# Patient Record
Sex: Male | Born: 1970 | Race: White | Hispanic: No | Smoking: Current every day smoker
Health system: Southern US, Community
[De-identification: ages and names within clinical notes are randomized; demographics above are authoritative.]

## PROBLEM LIST (undated history)

## (undated) DIAGNOSIS — I10 Essential (primary) hypertension: Secondary | ICD-10-CM

## (undated) DIAGNOSIS — E785 Hyperlipidemia, unspecified: Secondary | ICD-10-CM

## (undated) HISTORY — DX: Essential (primary) hypertension: I10

## (undated) HISTORY — PX: KNEE ARTHROSCOPY W/ MENISCECTOMY: SHX1879

## (undated) HISTORY — PX: KNEE SURGERY: SHX244

---

## 2006-09-26 ENCOUNTER — Emergency Department (HOSPITAL_COMMUNITY): Admission: EM | Admit: 2006-09-26 | Discharge: 2006-09-26 | Payer: Self-pay | Admitting: Emergency Medicine

## 2006-11-08 ENCOUNTER — Encounter: Admission: RE | Admit: 2006-11-08 | Discharge: 2006-11-08 | Payer: Self-pay | Admitting: Orthopaedic Surgery
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
## 2017-01-18 DIAGNOSIS — I10 Essential (primary) hypertension: Secondary | ICD-10-CM | POA: Insufficient documentation

## 2018-07-30 ENCOUNTER — Emergency Department: Payer: 59

## 2018-07-30 ENCOUNTER — Encounter: Payer: Self-pay | Admitting: Emergency Medicine

## 2018-07-30 ENCOUNTER — Emergency Department
Admission: EM | Admit: 2018-07-30 | Discharge: 2018-07-30 | Disposition: A | Discharge status: Home / Self Care | Payer: 59 | Attending: Student in an Organized Health Care Education/Training Program | Admitting: Student in an Organized Health Care Education/Training Program

## 2018-07-30 ENCOUNTER — Other Ambulatory Visit: Payer: Self-pay

## 2018-07-30 DIAGNOSIS — I10 Essential (primary) hypertension: Secondary | ICD-10-CM

## 2018-07-30 DIAGNOSIS — R1011 Right upper quadrant pain: Secondary | ICD-10-CM

## 2018-07-30 DIAGNOSIS — K439 Ventral hernia without obstruction or gangrene: Secondary | ICD-10-CM | POA: Insufficient documentation

## 2018-07-30 DIAGNOSIS — K8020 Calculus of gallbladder without cholecystitis without obstruction: Secondary | ICD-10-CM | POA: Diagnosis not present

## 2018-07-30 DIAGNOSIS — F17200 Nicotine dependence, unspecified, uncomplicated: Secondary | ICD-10-CM | POA: Insufficient documentation

## 2018-07-30 LAB — COMPREHENSIVE METABOLIC PANEL
ALT: 38 U/L (ref 0–44)
ANION GAP: 8 (ref 5–15)
AST: 28 U/L (ref 15–41)
Albumin: 4.7 g/dL (ref 3.5–5.0)
Alkaline Phosphatase: 66 U/L (ref 38–126)
BILIRUBIN TOTAL: 0.5 mg/dL (ref 0.3–1.2)
BUN: 17 mg/dL (ref 6–20)
CHLORIDE: 107 mmol/L (ref 98–111)
CO2: 22 mmol/L (ref 22–32)
Calcium: 9.1 mg/dL (ref 8.9–10.3)
Creatinine, Ser: 0.84 mg/dL (ref 0.61–1.24)
Glucose, Bld: 104 mg/dL — ABNORMAL HIGH (ref 70–99)
POTASSIUM: 3.9 mmol/L (ref 3.5–5.1)
Sodium: 137 mmol/L (ref 135–145)
TOTAL PROTEIN: 7.8 g/dL (ref 6.5–8.1)

## 2018-07-30 LAB — CBC
HEMATOCRIT: 50.1 % (ref 39.0–52.0)
HEMOGLOBIN: 17.3 g/dL — AB (ref 13.0–17.0)
MCH: 30.7 pg (ref 26.0–34.0)
MCHC: 34.5 g/dL (ref 30.0–36.0)
MCV: 88.8 fL (ref 80.0–100.0)
NRBC: 0 % (ref 0.0–0.2)
Platelets: 245 10*3/uL (ref 150–400)
RBC: 5.64 MIL/uL (ref 4.22–5.81)
RDW: 11.9 % (ref 11.5–15.5)
WBC: 9.9 10*3/uL (ref 4.0–10.5)

## 2018-07-30 LAB — LIPASE, BLOOD: Lipase: 30 U/L (ref 11–51)

## 2018-07-30 MED ORDER — SODIUM CHLORIDE 0.9% FLUSH: 3.0000 mL | Freq: Once | INTRAVENOUS | Status: DC

## 2018-07-30 MED ORDER — PROMETHAZINE HCL 12.5 MG PO TABS: 12.5000 mg | ORAL_TABLET | Freq: Four times a day (QID) | ORAL | 0 refills | Status: AC | PRN

## 2018-07-30 MED ORDER — AMLODIPINE BESYLATE 5 MG PO TABS: 5.0000 mg | ORAL_TABLET | Freq: Every day | ORAL | 0 refills | Status: DC

## 2018-07-30 MED ORDER — NAPROXEN 500 MG PO TABS: 500.0000 mg | ORAL_TABLET | Freq: Two times a day (BID) | ORAL | 0 refills | Status: DC

## 2018-07-30 NOTE — Discharge Instructions (Signed)

## 2018-07-30 NOTE — ED Provider Notes (Signed)
[facility] Emergency Department Provider Note    First MD Initiated Contact with Patient 07/30/18 1417     (approximate)  I have reviewed the triage vital signs and the nursing notes.   HISTORY  Chief Complaint Abdominal Pain    HPI Dominic Gross is a 48 y.o. male with a 1 pack/day smoking history presents the ER for evaluation of right upper quadrant abdominal pain.  Patient also concerned that he is developing a hernia.  Does lift heavy objects at work.  States that the pain in the right upper quadrant is stabbing in nature.  Denies any fevers.  States the pain is mild to moderate.  Denies any chest pain or shortness of breath.  Patient also concerned that his blood pressure is elevated.    History reviewed. No pertinent past medical history. No family history on file. Past Surgical History:  Procedure Laterality Date  . KNEE SURGERY Left    There are no active problems to display for this patient.     Prior to Admission medications   Medication Sig Start Date End Date Taking? Authorizing Provider  amLODipine (NORVASC) 5 MG tablet Take 1 tablet (5 mg total) by mouth daily. 07/30/18 07/30/19  Merlyn Lot, MD  naproxen (NAPROSYN) 500 MG tablet Take 1 tablet (500 mg total) by mouth 2 (two) times daily with a meal. 07/30/18 07/30/19  Merlyn Lot, MD  promethazine (PHENERGAN) 12.5 MG tablet Take 1 tablet (12.5 mg total) by mouth every 6 (six) hours as needed for nausea or vomiting. 07/30/18   Merlyn Lot, MD    Allergies Patient has no known allergies.    Social History Social History   Tobacco Use  . Smoking status: Current Every Day Smoker  . Smokeless tobacco: Never Used  Substance Use Topics  . Alcohol use: Not Currently  . Drug use: Not on file    Review of Systems Patient denies headaches, rhinorrhea, blurry vision, numbness, shortness of breath, chest pain, edema, cough, abdominal pain, nausea, vomiting, diarrhea,  dysuria, fevers, rashes or hallucinations unless otherwise stated above in HPI. ____________________________________________   PHYSICAL EXAM:  VITAL SIGNS: Vitals:   07/30/18 1240  BP: (!) 166/98  Pulse: 100  Resp: 16  Temp: 98.1 F (36.7 C)  SpO2: 96%    Constitutional: Alert and oriented.  Eyes: Conjunctivae are normal.  Head: Atraumatic. Nose: No congestion/rhinnorhea. Mouth/Throat: Mucous membranes are moist.   Neck: No stridor. Painless ROM.  Cardiovascular: Normal rate, regular rhythm. Grossly normal heart sounds.  Good peripheral circulation. Respiratory: Normal respiratory effort.  No retractions. Lungs CTAB. Gastrointestinal: Soft with soft reducible ventral hernia only present with Valsalva.  Mild tenderness to palpation the right upper quadrant without peritonitis or rebound or guarding.  No distention. No abdominal bruits. No CVA tenderness. Genitourinary: deferred Musculoskeletal: No lower extremity tenderness nor edema.  No joint effusions. Neurologic:  Normal speech and language. No gross focal neurologic deficits are appreciated. No facial droop Skin:  Skin is warm, dry and intact. No rash noted. Psychiatric: Mood and affect are normal. Speech and behavior are normal.  ____________________________________________   LABS (all labs ordered are listed, but only abnormal results are displayed)  Results for orders placed or performed during the hospital encounter of 07/30/18 (from the past 24 hour(s))  Lipase, blood     Status: None   Collection Time: 07/30/18 12:50 PM  Result Value Ref Range   Lipase 30 11 - 51 U/L  Comprehensive metabolic panel  Status: Abnormal   Collection Time: 07/30/18 12:50 PM  Result Value Ref Range   Sodium 137 135 - 145 mmol/L   Potassium 3.9 3.5 - 5.1 mmol/L   Chloride 107 98 - 111 mmol/L   CO2 22 22 - 32 mmol/L   Glucose, Bld 104 (H) 70 - 99 mg/dL   BUN 17 6 - 20 mg/dL   Creatinine, Ser 0.84 0.61 - 1.24 mg/dL   Calcium 9.1  8.9 - 10.3 mg/dL   Total Protein 7.8 6.5 - 8.1 g/dL   Albumin 4.7 3.5 - 5.0 g/dL   AST 28 15 - 41 U/L   ALT 38 0 - 44 U/L   Alkaline Phosphatase 66 38 - 126 U/L   Total Bilirubin 0.5 0.3 - 1.2 mg/dL   GFR calc non Af Amer >60 >60 mL/min   GFR calc Af Amer >60 >60 mL/min   Anion gap 8 5 - 15  CBC     Status: Abnormal   Collection Time: 07/30/18 12:50 PM  Result Value Ref Range   WBC 9.9 4.0 - 10.5 K/uL   RBC 5.64 4.22 - 5.81 MIL/uL   Hemoglobin 17.3 (H) 13.0 - 17.0 g/dL   HCT 50.1 39.0 - 52.0 %   MCV 88.8 80.0 - 100.0 fL   MCH 30.7 26.0 - 34.0 pg   MCHC 34.5 30.0 - 36.0 g/dL   RDW 11.9 11.5 - 15.5 %   Platelets 245 150 - 400 K/uL   nRBC 0.0 0.0 - 0.2 %   ____________________________________________  EKG My review and personal interpretation at Time: 12:49   Indication: htn  Rate: 90  Rhythm: sinus Axis: leftward Other: normal intervals, no stemi ____________________________________________  RADIOLOGY  I personally reviewed all radiographic images ordered to evaluate for the above acute complaints and reviewed radiology reports and findings.  These findings were personally discussed with the patient.  Please see medical record for radiology report.  ____________________________________________   PROCEDURES  Procedure(s) performed:  Procedures    Critical Care performed: no ____________________________________________   INITIAL IMPRESSION / ASSESSMENT AND PLAN / ED COURSE  Pertinent labs & imaging results that were available during my care of the patient were reviewed by me and considered in my medical decision making (see chart for details).   DDX: Hernia, mass, cholelithiasis, biliary colic, cholecystitis, gastritis  Dominic Gross is a 48 y.o. who presents to the ED with symptoms as described above.  He is afebrile and hemodynamically stable.  Has a soft reducible hernia for which we will give referral to surgery.  Blood work is reassuring.  Given his acute right  upper quadrant pain will order ultrasound to exclude acute pathology.  Discussed need for smoking cessation.   Ultrasound shows evidence of cholelithiasis.  No evidence of cholecystitis.  Patient otherwise with a benign abdominal exam at this time.  He is tolerating oral hydration.  Patient stable and appropriate for outpatient follow-up.  Reportedly having episodes of multiple readings of systolic pressures in the 160s therefore will start on low-dose Norvasc.  Have discussed with the patient and available family all diagnostics and treatments performed thus far and all questions were answered to the best of my ability. The patient demonstrates understanding and agreement with plan.    As part of my medical decision making, I reviewed the following data within the [place] notes reviewed and incorporated, Labs reviewed, notes from prior ED visits and [place] Controlled Substance Database   ____________________________________________  FINAL CLINICAL IMPRESSION(S) / ED DIAGNOSES  Final diagnoses:  RUQ pain  Calculus of gallbladder without cholecystitis without obstruction  Ventral hernia without obstruction or gangrene  Hypertension, unspecified type      NEW MEDICATIONS STARTED DURING THIS VISIT:  New Prescriptions   AMLODIPINE (NORVASC) 5 MG TABLET    Take 1 tablet (5 mg total) by mouth daily.   NAPROXEN (NAPROSYN) 500 MG TABLET    Take 1 tablet (500 mg total) by mouth 2 (two) times daily with a meal.   PROMETHAZINE (PHENERGAN) 12.5 MG TABLET    Take 1 tablet (12.5 mg total) by mouth every 6 (six) hours as needed for nausea or vomiting.     Note:  This document was prepared using Dragon voice recognition software and may include unintentional dictation errors.    Merlyn Lot, MD 07/30/18 [phone]

## 2018-07-30 NOTE — ED Triage Notes (Signed)
Says pain like poking in right upper quadrant this am whiledriving to work.  Says has lump or ?hernia midupper abd for about a year.

## 2018-08-01 ENCOUNTER — Other Ambulatory Visit: Payer: Self-pay

## 2018-08-01 ENCOUNTER — Encounter: Payer: Self-pay | Admitting: *Deleted

## 2018-08-01 ENCOUNTER — Encounter: Payer: Self-pay | Admitting: Surgery

## 2018-08-01 ENCOUNTER — Ambulatory Visit (INDEPENDENT_AMBULATORY_CARE_PROVIDER_SITE_OTHER): Payer: 59 | Admitting: Surgery

## 2018-08-01 VITALS — BP 156/91 | HR 85 | Temp 98.4°F | Resp 18 | Ht 72.0 in | Wt 294.0 lb

## 2018-08-01 DIAGNOSIS — Z800 Family history of malignant neoplasm of digestive organs: Secondary | ICD-10-CM

## 2018-08-01 DIAGNOSIS — K8050 Calculus of bile duct without cholangitis or cholecystitis without obstruction: Secondary | ICD-10-CM | POA: Diagnosis not present

## 2018-08-01 DIAGNOSIS — F17200 Nicotine dependence, unspecified, uncomplicated: Secondary | ICD-10-CM

## 2018-08-01 NOTE — Progress Notes (Signed)
Patient came back by the office after he left and states PCP appointment has been moved up to 08-08-18 at 10:20 am at [facility].

## 2018-08-01 NOTE — Progress Notes (Signed)
Patient has been scheduled for an appointment to establish primary care with Raelyn Ensign, [place] at [facility] for 09-04-18 at 7:20 am. Patient aware.   Referral made to [facility] for colonoscopy. Patient needs to have this done prior to robotic cholecystectomy.   Surgery will be scheduled once colonoscopy with [facility] has been arranged.   Patient aware of the above and instructed to call the office should he have further questions.

## 2018-08-01 NOTE — Patient Instructions (Signed)
We will refer you to [facility] to have a consult for Colonoscopy.   We will send referral to Primary care physician as well.    You have requested to have your gallbladder removed. This will be done  at [facility] with Dr. Caroleen Hamman.   You will most likely be out of work 1-2 weeks for this surgery. You will return after your post-op appointment with a lifting restriction for approximately 4 more weeks.  You will be able to eat anything you would like to following surgery. But, start by eating a bland diet and advance this as tolerated. The Gallbladder diet is below, please go as closely by this diet as possible prior to surgery to avoid any further attacks.  Please see the (blue)pre-care form that you have been given today. If you have any questions, please call our office.  Laparoscopic Cholecystectomy Laparoscopic cholecystectomy is surgery to remove the gallbladder. The gallbladder is located in the upper right part of the abdomen, behind the liver. It is a storage sac for bile, which is produced in the liver. Bile aids in the digestion and absorption of fats. Cholecystectomy is often done for inflammation of the gallbladder (cholecystitis). This condition is usually caused by a buildup of gallstones (cholelithiasis) in the gallbladder. Gallstones can block the flow of bile, and that can result in inflammation and pain. In severe cases, emergency surgery may be required. If emergency surgery is not required, you will have time to prepare for the procedure. Laparoscopic surgery is an alternative to open surgery. Laparoscopic surgery has a shorter recovery time. Your common bile duct may also need to be examined during the procedure. If stones are found in the common bile duct, they may be removed. LET [facility] CARE PROVIDER KNOW ABOUT:  Any allergies you have.  All medicines you are taking, including vitamins, herbs, eye drops, creams, and over-the-counter medicines.  Previous  problems you or members of your family have had with the use of anesthetics.  Any blood disorders you have.  Previous surgeries you have had.    Any medical conditions you have. RISKS AND COMPLICATIONS Generally, this is a safe procedure. However, problems may occur, including:  Infection.  Bleeding.  Allergic reactions to medicines.  Damage to other structures or organs.  A stone remaining in the common bile duct.  A bile leak from the cyst duct that is clipped when your gallbladder is removed.  The need to convert to open surgery, which requires a larger incision in the abdomen. This may be necessary if your surgeon thinks that it is not safe to continue with a laparoscopic procedure. BEFORE THE PROCEDURE  Ask your health care provider about:  Changing or stopping your regular medicines. This is especially important if you are taking diabetes medicines or blood thinners.  Taking medicines such as aspirin and ibuprofen. These medicines can thin your blood. Do not take these medicines before your procedure if your health care provider instructs you not to.  Follow instructions from your health care provider about eating or drinking restrictions.  Let your health care provider know if you develop a cold or an infection before surgery.  Plan to have someone take you home after the procedure.  Ask your health care provider how your surgical site will be marked or identified.  You may be given antibiotic medicine to help prevent infection. PROCEDURE  To reduce your risk of infection:  Your health care team will wash or sanitize their hands.  Your skin will be washed with soap.  An IV tube may be inserted into one of your veins.  You will be given a medicine to make you fall asleep (general anesthetic).  A breathing tube will be placed in your mouth.  The surgeon will make several small cuts (incisions) in your abdomen.  A thin, lighted tube (laparoscope) that has  a tiny camera on the end will be inserted through one of the small incisions. The camera on the laparoscope will send a picture to a TV screen (monitor) in the operating room. This will give the surgeon a good view inside your abdomen.  A gas will be pumped into your abdomen. This will expand your abdomen to give the surgeon more room to perform the surgery.  Other tools that are needed for the procedure will be inserted through the other incisions. The gallbladder will be removed through one of the incisions.  After your gallbladder has been removed, the incisions will be closed with stitches (sutures), staples, or skin glue.  Your incisions may be covered with a bandage (dressing). The procedure may vary among health care providers and hospitals. AFTER THE PROCEDURE  Your blood pressure, heart rate, breathing rate, and blood oxygen level will be monitored often until the medicines you were given have worn off.  You will be given medicines as needed to control your pain.   This information is not intended to replace advice given to you by your health care provider. Make sure you discuss any questions you have with your health care provider.   Document Released: 05/23/2005 Document Revised: 02/11/2015 Document Reviewed: 01/02/2013 Elsevier Interactive Patient Education 2016 [place] Diet for Gallbladder Conditions A low-fat diet can be helpful if you have pancreatitis or a gallbladder condition. With these conditions, your pancreas and gallbladder have trouble digesting fats. A healthy eating plan with less fat will help rest your pancreas and gallbladder and reduce your symptoms. WHAT DO I NEED TO KNOW ABOUT THIS DIET?  Eat a low-fat diet.  Reduce your fat intake to less than 20-30% of your total daily calories. This is less than 50-60 g of fat per day.  Remember that you need some fat in your diet. Ask your dietician what your daily goal should be.  Choose nonfat and  low-fat healthy foods. Look for the words "nonfat," "low fat," or "fat free."  As a guide, look on the label and choose foods with less than 3 g of fat per serving. Eat only one serving.  Avoid alcohol.  Do not smoke. If you need help quitting, talk with your health care provider.  Eat small frequent meals instead of three large heavy meals. WHAT FOODS CAN I EAT? Grains Include healthy grains and starches such as potatoes, wheat bread, fiber-rich cereal, and brown rice. Choose whole grain options whenever possible. In adults, whole grains should account for 45-65% of your daily calories.  Fruits and Vegetables Eat plenty of fruits and vegetables. Fresh fruits and vegetables add fiber to your diet. Meats and Other Protein Sources Eat lean meat such as chicken and pork. Trim any fat off of meat before cooking it. Eggs, fish, and beans are other sources of protein. In adults, these foods should account for 10-35% of your daily calories. Dairy Choose low-fat milk and dairy options. Dairy includes fat and protein, as well as calcium.  Fats and Oils Limit high-fat foods such as fried foods, sweets, baked goods, sugary drinks.  Other Creamy  sauces and condiments, such as mayonnaise, can add extra fat. Think about whether or not you need to use them, or use smaller amounts or low fat options. WHAT FOODS ARE NOT RECOMMENDED?  High fat foods, such as:  [employer].  Ice cream.  [place] toast.  Sweet rolls.  Pizza.  Cheese bread.  Foods covered with batter, butter, creamy sauces, or cheese.  Fried foods.  Sugary drinks and desserts.  Foods that cause gas or bloating   This information is not intended to replace advice given to you by your health care provider. Make sure you discuss any questions you have with your health care provider.   Document Released: 05/28/2013 Document Reviewed: 05/28/2013 Elsevier Interactive Patient Education [employer].

## 2018-08-02 ENCOUNTER — Other Ambulatory Visit: Payer: Self-pay

## 2018-08-02 ENCOUNTER — Encounter: Payer: Self-pay | Admitting: Surgery

## 2018-08-02 DIAGNOSIS — Z800 Family history of malignant neoplasm of digestive organs: Secondary | ICD-10-CM

## 2018-08-02 DIAGNOSIS — Z1211 Encounter for screening for malignant neoplasm of colon: Secondary | ICD-10-CM

## 2018-08-02 MED ORDER — NA SULFATE-K SULFATE-MG SULF 17.5-3.13-1.6 GM/177ML PO SOLN: 1.0000 | Freq: Once | ORAL | 0 refills | Status: AC

## 2018-08-02 NOTE — Progress Notes (Signed)
Patient ID: Dominic Gross, male   DOB: 04/27/1971, 48 y.o.   MRN: [id number]  HPI Dominic Gross is a 48 y.o. male seen in consultation at the request of Dr. Quentin Cornwall.  The days ago he had an attack consisting of right upper quadrant and epigastric pain.  The pain was sharp moderate in intensity he also describes as a queasy sensation.  Decreased appetite but no nausea or vomiting. painradiates to upper midline.  No fevers no chills no evidence of biliary obstruction.  He has never had an abdominal  operation. He does have a family history significant for colon cancer in her mother at age 72.  He has never had a colonoscopy in his life.  He was recently started on blood pressure medications by the ER. Ultrasound personally reviewed showing evidence of cholelithiasis.  Normal common bile duct.  His CBC and CMP were completely normal. Specifically denies any weight loss, melena or hematochezia. He Does feel some bloating after eating certain fatty meals. Is a heavy smoker of over a pack a day  HPI  Past Medical History:  Diagnosis Date  . Hypertension     Past Surgical History:  Procedure Laterality Date  . KNEE SURGERY Left     Family History  Problem Relation Age of Onset  . Colon cancer Mother   . Colon cancer Father     Social History Social History   Tobacco Use  . Smoking status: Current Every Day Smoker    Packs/day: 1.50  . Smokeless tobacco: Never Used  Substance Use Topics  . Alcohol use: Never    Frequency: Never  . Drug use: Never    No Known Allergies  Current Outpatient Medications  Medication Sig Dispense Refill  . amLODipine (NORVASC) 5 MG tablet Take 1 tablet (5 mg total) by mouth daily. (Patient not taking: Reported on 08/01/2018) 30 tablet 0  . naproxen (NAPROSYN) 500 MG tablet Take 1 tablet (500 mg total) by mouth 2 (two) times daily with a meal. (Patient not taking: Reported on 08/01/2018) 20 tablet 0  . promethazine (PHENERGAN) 12.5 MG tablet Take 1 tablet  (12.5 mg total) by mouth every 6 (six) hours as needed for nausea or vomiting. (Patient not taking: Reported on 08/01/2018) 12 tablet 0   No current facility-administered medications for this visit.      Review of Systems Full ROS  was asked and was negative except for the information on the HPI  Physical Exam Blood pressure (!) 156/91, pulse 85, temperature 98.4 F (36.9 C), temperature source Temporal, resp. rate 18, height 6' (1.829 m), weight 294 lb (133.4 kg), SpO2 98 %. CONSTITUTIONAL: NAD obese male EYES: Pupils are equal, round, and reactive to light, Sclera are non-icteric. EARS, NOSE, MOUTH AND THROAT: The oropharynx is clear. The oral mucosa is pink and moist. Hearing is intact to voice. LYMPH NODES:  Lymph nodes in the neck are normal. RESPIRATORY:  Lungs are clear. There is normal respiratory effort, with equal breath sounds bilaterally, and without pathologic use of accessory muscles. CARDIOVASCULAR: Heart is regular without murmurs, gallops, or rubs. GI: The abdomen is  soft, nontender, and nondistended. There are no palpable masses. There is no hepatosplenomegaly. There are normal bowel sounds in all quadrants. 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 GU: Rectal deferred.   MUSCULOSKELETAL: Normal muscle strength and tone. No cyanosis or edema.   SKIN: Turgor is good and there are no pathologic skin lesions or ulcers. NEUROLOGIC: Motor and  sensation is grossly normal. Cranial nerves are grossly intact. PSYCH:  Oriented to person, place and time. Affect is normal.  Data Reviewed  I have personally reviewed the patient's imaging, laboratory findings and medical records.    Assessment/Plan  48 year old obese and smoker male with symptomatic cholelithiasis.  Given that he does have a strong family history of colon cancer I will refer him for a colonoscopy before his cholecystectomy.  I do think that he is a good candidate for robotic cholecystectomy.   Regarding his diastases recti I would not recommend any intervention at this time. The risks, benefits, complications, treatment options, and expected outcomes were discussed with the patient. The possibilities of bleeding, recurrent infection, finding a normal gallbladder, perforation of viscus organs, damage to surrounding structures, bile leak, abscess formation, needing a drain placed, the need for additional procedures, reaction to medication, pulmonary aspiration,  failure to diagnose a condition, the possible need to convert to an open procedure, and creating a complication requiring transfusion or operation were discussed with the patient. The patient and/or family concurred with the proposed plan, giving informed consent.  A copy of this report was sent to the referring provider   Caroleen Hamman, MD FACS General Surgeon 08/02/2018, 7:44 AM

## 2018-08-08 ENCOUNTER — Ambulatory Visit: Payer: 59 | Admitting: Family Medicine

## 2018-08-08 ENCOUNTER — Other Ambulatory Visit: Payer: Self-pay

## 2018-08-08 ENCOUNTER — Encounter: Payer: Self-pay | Admitting: Family Medicine

## 2018-08-08 VITALS — BP 132/84 | HR 96 | Temp 98.2°F | Resp 18 | Ht 70.0 in | Wt 292.6 lb

## 2018-08-08 DIAGNOSIS — Z125 Encounter for screening for malignant neoplasm of prostate: Secondary | ICD-10-CM

## 2018-08-08 DIAGNOSIS — Z1159 Encounter for screening for other viral diseases: Secondary | ICD-10-CM | POA: Diagnosis not present

## 2018-08-08 DIAGNOSIS — K8020 Calculus of gallbladder without cholecystitis without obstruction: Secondary | ICD-10-CM | POA: Diagnosis not present

## 2018-08-08 DIAGNOSIS — Z13220 Encounter for screening for lipoid disorders: Secondary | ICD-10-CM

## 2018-08-08 DIAGNOSIS — F39 Unspecified mood [affective] disorder: Secondary | ICD-10-CM | POA: Diagnosis not present

## 2018-08-08 DIAGNOSIS — Z800 Family history of malignant neoplasm of digestive organs: Secondary | ICD-10-CM | POA: Diagnosis not present

## 2018-08-08 DIAGNOSIS — Z113 Encounter for screening for infections with a predominantly sexual mode of transmission: Secondary | ICD-10-CM

## 2018-08-08 DIAGNOSIS — I10 Essential (primary) hypertension: Secondary | ICD-10-CM

## 2018-08-08 DIAGNOSIS — Z6841 Body Mass Index (BMI) 40.0 and over, adult: Secondary | ICD-10-CM

## 2018-08-08 MED ORDER — AMLODIPINE BESYLATE 5 MG PO TABS: 5.0000 mg | ORAL_TABLET | Freq: Every day | ORAL | 1 refills | Status: AC

## 2018-08-08 NOTE — Progress Notes (Signed)
New Patient Office Visit  Subjective:  Patient ID: Dominic Gross, male    DOB: [birth year]  Age: 48 y.o. MRN: [id number]  CC:  Chief Complaint  Patient presents with  . Establish Care  . Consult    Colonscopy scheduled on March 16 and Gallbladder surgery to follow     HPI [name] presents to establish care and for the following:  RUQ pain: Was seen 07/30/2018 for RUE pain in the ER and was diagnosed with Gallbladder stones without cholecystitis, is taking phenergan and naproxen which seem to help his symptoms.  Saw Dr. Dahlia Byes on 08/01/2018 and will be scheduled after he has his colonoscopy.    HM: Having colonoscopy on 08/20/2018 by Dr. Marius Ditch. Parents both had colorectal cancer - mother died from this at age 31. Dad was diagnosed but is unsure of what age.  He has not been to see a provider in 10 years until his recent ER visit.   Obesity: Was about 190lbs 10 years ago, at heaviest weight recently, struggles with over eating and mood issues. He eats one meal a day at night instead of eating throughout the day.  Sister with hypothyroidism, but no thyroid cancer. No family history of thyroid cancer, no personal history of pancreatitis or thyroid cancer.   Depression and Anxiety:  History of suicide attempt x2 in his 42's.  No thoughts of suicide today.  He has struggled with depression and overeating for many years. Stressors include weight gain and work.     Office Visit from 08/08/2018 in [facility]  PHQ-9 Total Score  17     HTN: Found in the ER recently, put on amlodipine 44m daily, doing well on this.  DASH diet is discussed. Denies chest pain, BLE edema, or orthopnea.  Has occasional shortness of breath with exertion.  Loud Snoring: Loud snoring for several years, does not wake up choking/gasping for air, no witnessed apnea episodes.  We will hold off on referral today, but will consider at CPE if energy levels are not improving.   Tobacco Abuse: 1-1.5 ppd.  Not  ready to quit.  Took medication 10 years ago and it caused some psychiatry   Past Medical History:  Diagnosis Date  . Hypertension     Past Surgical History:  Procedure Laterality Date  . KNEE SURGERY Left     Family History  Problem Relation Age of Onset  . Colon cancer Mother   . Colon cancer Father     Social History   Socioeconomic History  . Marital status: Single    Spouse name: Not on file  . Number of children: 1  . Years of education: Not on file  . Highest education level: Not on file  Occupational History  . Not on file  Social Needs  . Financial resource strain: Not hard at all  . Food insecurity:    Worry: Never true    Inability: Never true  . Transportation needs:    Medical: No    Non-medical: No  Tobacco Use  . Smoking status: Current Every Day Smoker    Packs/day: 1.50    Years: 19.00    Pack years: 28.50    Types: Cigarettes  . Smokeless tobacco: Never Used  Substance and Sexual Activity  . Alcohol use: Never    Frequency: Never  . Drug use: Never  . Sexual activity: Yes    Partners: Female  Lifestyle  . Physical activity:    Days per  week: 0 days    Minutes per session: 0 min  . Stress: Not at all  Relationships  . Social connections:    Talks on phone: More than three times a week    Gets together: More than three times a week    Attends religious service: Never    Active member of club or organization: No    Attends meetings of clubs or organizations: Never    Relationship status: Living with partner  . Intimate partner violence:    Fear of current or ex partner: No    Emotionally abused: No    Physically abused: No    Forced sexual activity: No  Other Topics Concern  . Not on file  Social History Narrative  . Not on file    ROS Review of Systems  Constitutional: Negative for chills, fatigue, fever and unexpected weight change.  HENT: Negative.   Respiratory: Positive for cough (AM only) and shortness of breath (See  HPI). Negative for wheezing.   Cardiovascular: Negative for chest pain, palpitations and leg swelling.  Gastrointestinal: Negative for abdominal pain, constipation, diarrhea, nausea and vomiting.  Endocrine: Negative.  Negative for polydipsia, polyphagia and polyuria.  Musculoskeletal: Negative for gait problem and joint swelling.  Skin: Negative for rash.  Neurological: Negative for dizziness, weakness and headaches.  Psychiatric/Behavioral: Negative for dysphoric mood and sleep disturbance. The patient is not nervous/anxious.   All other systems reviewed and are negative.  Objective:   Today's Vitals: BP 132/84 (BP Location: Right Arm, Patient Position: Sitting, Cuff Size: Large)   Pulse 96   Temp 98.2 F (36.8 C) (Oral)   Resp 18   Ht '5\' 10"'  (1.778 m)   Wt 292 lb 9.6 oz (132.7 kg)   SpO2 99%   BMI 41.98 kg/m   Physical Exam  Assessment & Plan:   Problem List Items Addressed This Visit    None    Visit Diagnoses    Calculus of gallbladder without cholecystitis without obstruction    -  Primary   Family history of colorectal cancer       Class 3 severe obesity due to excess calories with serious comorbidity and body mass index (BMI) of 40.0 to 44.9 in adult [facility])       Relevant Orders   Hemoglobin A1c   COMPLETE METABOLIC PANEL WITH GFR   TSH   Lipid panel   Moderate episode of recurrent major depressive disorder (HCC)       Need for hepatitis C screening test       Relevant Orders   Hepatitis C antibody   Routine screening for STI (sexually transmitted infection)       Relevant Orders   RPR   HIV Antibody (routine testing w rflx)   Prostate cancer screening       Relevant Orders   PSA   Lipid screening       Relevant Orders   Lipid panel     Outpatient Encounter Medications as of 08/08/2018  Medication Sig  . amLODipine (NORVASC) 5 MG tablet Take 1 tablet (5 mg total) by mouth daily.  . naproxen (NAPROSYN) 500 MG tablet Take 1 tablet (500 mg total) by mouth 2  (two) times daily with a meal.  . promethazine (PHENERGAN) 12.5 MG tablet Take 1 tablet (12.5 mg total) by mouth every 6 (six) hours as needed for nausea or vomiting.  [facility] Kitchen SUPREP BOWEL PREP KIT 17.5-3.13-1.6 GM/177ML SOLN    No facility-administered encounter medications on  file as of 08/08/2018.    Follow-up: Return in about 4 weeks (around 09/05/2018) for CPE + Follow Up - 17mn appt.   EHubbard Hartshorn FNP

## 2018-08-08 NOTE — Addendum Note (Signed)
Addended by: Hubbard Hartshorn on: 08/08/2018 12:15 PM   Modules accepted: Orders

## 2018-08-08 NOTE — Patient Instructions (Addendum)
Psychologytoday.com therapist finder  Here are some resources to help you if you feel you are in a mental health crisis:  [place] - Call [phone]  for help - Website with more resources: GripTrip.com.pt  [facility] - Call [phone] for help. - Mobile Crisis Program available 24 hours a day, 365 days a year. - Available for anyone of any age in [place] counties.  [facility] - Address: [address], [place] - Telephone: [phone]  - Hours of Operation: Sunday - Saturday - 8:00 a.m. - 8:00 p.m. - Medicaid, Medicare (Government Issued Only), BCBS, and [place] [facility], [place], Psychiatrists on-site to provide medication management, [place], and Peer Support Care.  [facility] - Call [phone] for help. - Mobile Crisis Program available 24 hours a day, 365 days a year. - Available for anyone of any age in [place]   Diabetes Mellitus and Nutrition, Adult When you have diabetes (diabetes mellitus), it is very important to have healthy eating habits because your blood sugar (glucose) levels are greatly affected by what you eat and drink. Eating healthy foods in the appropriate amounts, at about the same times every day, can help you:  Control your blood glucose.  Lower your risk of heart disease.  Improve your blood pressure.  Reach or maintain a healthy weight. Every person with diabetes is different, and each person has different needs for a meal plan. Your health care provider may recommend that you work with a diet and nutrition specialist (dietitian) to make a meal plan that is best for you. Your meal plan may vary depending on factors such as:  The calories you need.  The medicines you take.  Your weight.  Your blood glucose, blood  pressure, and cholesterol levels.  Your activity level.  Other health conditions you have, such as heart or kidney disease. How do carbohydrates affect me? Carbohydrates, also called carbs, affect your blood glucose level more than any other type of food. Eating carbs naturally raises the amount of glucose in your blood. Carb counting is a method for keeping track of how many carbs you eat. Counting carbs is important to keep your blood glucose at a healthy level, especially if you use insulin or take certain oral diabetes medicines. It is important to know how many carbs you can safely have in each meal. This is different for every person. Your dietitian can help you calculate how many carbs you should have at each meal and for each snack. Foods that contain carbs include:  Bread, cereal, rice, pasta, and crackers.  Potatoes and corn.  Peas, beans, and lentils.  Milk and yogurt.  Fruit and juice.  Desserts, such as cakes, cookies, ice cream, and candy. How does alcohol affect me? Alcohol can cause a sudden decrease in blood glucose (hypoglycemia), especially if you use insulin or take certain oral diabetes medicines. Hypoglycemia can be a life-threatening condition. Symptoms of hypoglycemia (sleepiness, dizziness, and confusion) are similar to symptoms of having too much alcohol. If your health care provider says that alcohol is safe for you, follow these guidelines:  Limit alcohol intake to no more than 1 drink per day for nonpregnant women and 2 drinks per day for men. One drink equals 12 oz of beer, 5 oz of wine, or 1 oz of hard liquor.  Do not drink on an empty stomach.  Keep yourself  hydrated with water, diet soda, or unsweetened iced tea.  Keep in mind that regular soda, juice, and other mixers may contain a lot of sugar and must be counted as carbs. What are tips for following this plan?  Reading food labels  Start by checking the serving size on the "Nutrition Facts"  label of packaged foods and drinks. The amount of calories, carbs, fats, and other nutrients listed on the label is based on one serving of the item. Many items contain more than one serving per package.  Check the total grams (g) of carbs in one serving. You can calculate the number of servings of carbs in one serving by dividing the total carbs by 15. For example, if a food has 30 g of total carbs, it would be equal to 2 servings of carbs.  Check the number of grams (g) of saturated and trans fats in one serving. Choose foods that have low or no amount of these fats.  Check the number of milligrams (mg) of salt (sodium) in one serving. Most people should limit total sodium intake to less than 2,300 mg per day.  Always check the nutrition information of foods labeled as "low-fat" or "nonfat". These foods may be higher in added sugar or refined carbs and should be avoided.  Talk to your dietitian to identify your daily goals for nutrients listed on the label. Shopping  Avoid buying canned, premade, or processed foods. These foods tend to be high in fat, sodium, and added sugar.  Shop around the outside edge of the grocery store. This includes fresh fruits and vegetables, bulk grains, fresh meats, and fresh dairy. Cooking  Use low-heat cooking methods, such as baking, instead of high-heat cooking methods like deep frying.  Cook using healthy oils, such as olive, canola, or sunflower oil.  Avoid cooking with butter, cream, or high-fat meats. Meal planning  Eat meals and snacks regularly, preferably at the same times every day. Avoid going long periods of time without eating.  Eat foods high in fiber, such as fresh fruits, vegetables, beans, and whole grains. Talk to your dietitian about how many servings of carbs you can eat at each meal.  Eat 4-6 ounces (oz) of lean protein each day, such as lean meat, chicken, fish, eggs, or tofu. One oz of lean protein is equal to: ? 1 oz of meat,  chicken, or fish. ? 1 egg. ?  cup of tofu.  Eat some foods each day that contain healthy fats, such as avocado, nuts, seeds, and fish. Lifestyle  Check your blood glucose regularly.  Exercise regularly as told by your health care provider. This may include: ? 150 minutes of moderate-intensity or vigorous-intensity exercise each week. This could be brisk walking, biking, or water aerobics. ? Stretching and doing strength exercises, such as yoga or weightlifting, at least 2 times a week.  Take medicines as told by your health care provider.  Do not use any products that contain nicotine or tobacco, such as cigarettes and e-cigarettes. If you need help quitting, ask your health care provider.  Work with a Social worker or diabetes educator to identify strategies to manage stress and any emotional and social challenges. Questions to ask a health care provider  Do I need to meet with a diabetes educator?  Do I need to meet with a dietitian?  What number can I call if I have questions?  When are the best times to check my blood glucose? Where to find more information:  American Diabetes Association: diabetes.org  Academy of Nutrition and Dietetics: www.eatright.[employer] of Diabetes and Digestive and Kidney Diseases (NIH): [employer].dk Summary  A healthy meal plan will help you control your blood glucose and maintain a healthy lifestyle.  Working with a diet and nutrition specialist (dietitian) can help you make a meal plan that is best for you.  Keep in mind that carbohydrates (carbs) and alcohol have immediate effects on your blood glucose levels. It is important to count carbs and to use alcohol carefully. This information is not intended to replace advice given to you by your health care provider. Make sure you discuss any questions you have with your health care provider. Document Released: 02/17/2005 Document Revised: 12/21/2016 Document Reviewed:  06/27/2016 Elsevier Interactive Patient Education  2019 [place] DASH stands for "Dietary Approaches to Stop Hypertension." The DASH eating plan is a healthy eating plan that has been shown to reduce high blood pressure (hypertension). It may also reduce your risk for type 2 diabetes, heart disease, and stroke. The DASH eating plan may also help with weight loss. What are tips for following this plan?  General guidelines  Avoid eating more than 2,300 mg (milligrams) of salt (sodium) a day. If you have hypertension, you may need to reduce your sodium intake to 1,500 mg a day.  Limit alcohol intake to no more than 1 drink a day for nonpregnant women and 2 drinks a day for men. One drink equals 12 oz of beer, 5 oz of wine, or 1 oz of hard liquor.  Work with your health care provider to maintain a healthy body weight or to lose weight. Ask what an ideal weight is for you.  Get at least 30 minutes of exercise that causes your heart to beat faster (aerobic exercise) most days of the week. Activities may include walking, swimming, or biking.  Work with your health care provider or diet and nutrition specialist (dietitian) to adjust your eating plan to your individual calorie needs. Reading food labels   Check food labels for the amount of sodium per serving. Choose foods with less than 5 percent of the Daily Value of sodium. Generally, foods with less than 300 mg of sodium per serving fit into this eating plan.  To find whole grains, look for the word "whole" as the first word in the ingredient list. Shopping  Buy products labeled as "low-sodium" or "no salt added."  Buy fresh foods. Avoid canned foods and premade or frozen meals. Cooking  Avoid adding salt when cooking. Use salt-free seasonings or herbs instead of table salt or sea salt. Check with your health care provider or pharmacist before using salt substitutes.  Do not fry foods. Cook foods using healthy  methods such as baking, boiling, grilling, and broiling instead.  Cook with heart-healthy oils, such as olive, canola, soybean, or sunflower oil. Meal planning  Eat a balanced diet that includes: ? 5 or more servings of fruits and vegetables each day. At each meal, try to fill half of your plate with fruits and vegetables. ? Up to 6-8 servings of whole grains each day. ? Less than 6 oz of lean meat, poultry, or fish each day. A 3-oz serving of meat is about the same size as a deck of cards. One egg equals 1 oz. ? 2 servings of low-fat dairy each day. ? A serving of nuts, seeds, or beans 5 times each week. ? Heart-healthy fats. Healthy fats called  Omega-3 fatty acids are found in foods such as flaxseeds and coldwater fish, like sardines, salmon, and mackerel.  Limit how much you eat of the following: ? Canned or prepackaged foods. ? Food that is high in trans fat, such as fried foods. ? Food that is high in saturated fat, such as fatty meat. ? Sweets, desserts, sugary drinks, and other foods with added sugar. ? Full-fat dairy products.  Do not salt foods before eating.  Try to eat at least 2 vegetarian meals each week.  Eat more home-cooked food and less restaurant, buffet, and fast food.  When eating at a restaurant, ask that your food be prepared with less salt or no salt, if possible. What foods are recommended? The items listed may not be a complete list. Talk with your dietitian about what dietary choices are best for you. Grains Whole-grain or whole-wheat bread. Whole-grain or whole-wheat pasta. Brown rice. [place]. Bulgur. Whole-grain and low-sodium cereals. Pita bread. Low-fat, low-sodium crackers. Whole-wheat flour tortillas. Vegetables Fresh or frozen vegetables (raw, steamed, roasted, or grilled). Low-sodium or reduced-sodium tomato and vegetable juice. Low-sodium or reduced-sodium tomato sauce and tomato paste. Low-sodium or reduced-sodium canned  vegetables. Fruits All fresh, dried, or frozen fruit. Canned fruit in natural juice (without added sugar). Meat and other protein foods Skinless chicken or [place]. Ground chicken or [place]. Pork with fat trimmed off. Fish and seafood. Egg whites. Dried beans, peas, or lentils. Unsalted nuts, nut butters, and seeds. Unsalted canned beans. Lean cuts of beef with fat trimmed off. Low-sodium, lean deli meat. Dairy Low-fat (1%) or fat-free (skim) milk. Fat-free, low-fat, or reduced-fat cheeses. Nonfat, low-sodium ricotta or cottage cheese. Low-fat or nonfat yogurt. Low-fat, low-sodium cheese. Fats and oils Soft margarine without trans fats. Vegetable oil. Low-fat, reduced-fat, or light mayonnaise and salad dressings (reduced-sodium). Canola, safflower, olive, soybean, and sunflower oils. Avocado. Seasoning and other foods Herbs. Spices. Seasoning mixes without salt. Unsalted popcorn and pretzels. Fat-free sweets. What foods are not recommended? The items listed may not be a complete list. Talk with your dietitian about what dietary choices are best for you. Grains Baked goods made with fat, such as croissants, muffins, or some breads. Dry pasta or rice meal packs. Vegetables Creamed or fried vegetables. Vegetables in a cheese sauce. Regular canned vegetables (not low-sodium or reduced-sodium). Regular canned tomato sauce and paste (not low-sodium or reduced-sodium). Regular tomato and vegetable juice (not low-sodium or reduced-sodium). Angie Fava. Olives. Fruits Canned fruit in a light or heavy syrup. Fried fruit. Fruit in cream or butter sauce. Meat and other protein foods Fatty cuts of meat. Ribs. Fried meat. Berniece Salines. Sausage. Bologna and other processed lunch meats. Salami. Fatback. Hotdogs. Bratwurst. Salted nuts and seeds. Canned beans with added salt. Canned or smoked fish. Whole eggs or egg yolks. Chicken or [place] with skin. Dairy Whole or 2% milk, cream, and half-and-half. Whole or full-fat  cream cheese. Whole-fat or sweetened yogurt. Full-fat cheese. Nondairy creamers. Whipped toppings. Processed cheese and cheese spreads. Fats and oils Butter. Stick margarine. Lard. Shortening. Ghee. Bacon fat. Tropical oils, such as coconut, palm kernel, or palm oil. Seasoning and other foods Salted popcorn and pretzels. Onion salt, garlic salt, seasoned salt, table salt, and sea salt. Worcestershire sauce. Tartar sauce. Barbecue sauce. Teriyaki sauce. Soy sauce, including reduced-sodium. Steak sauce. Canned and packaged gravies. Fish sauce. Oyster sauce. Cocktail sauce. Horseradish that you find on the shelf. Ketchup. Mustard. Meat flavorings and tenderizers. Bouillon cubes. Hot sauce and Tabasco sauce. Premade or packaged marinades. Premade  or packaged taco seasonings. Relishes. Regular salad dressings. Where to find more information:  National Heart, Lung, and [place]: https://wilson-eaton.com/  American Heart Association: www.heart.org Summary  The DASH eating plan is a healthy eating plan that has been shown to reduce high blood pressure (hypertension). It may also reduce your risk for type 2 diabetes, heart disease, and stroke.  With the DASH eating plan, you should limit salt (sodium) intake to 2,300 mg a day. If you have hypertension, you may need to reduce your sodium intake to 1,500 mg a day.  When on the DASH eating plan, aim to eat more fresh fruits and vegetables, whole grains, lean proteins, low-fat dairy, and heart-healthy fats.  Work with your health care provider or diet and nutrition specialist (dietitian) to adjust your eating plan to your individual calorie needs. This information is not intended to replace advice given to you by your health care provider. Make sure you discuss any questions you have with your health care provider. Document Released: 05/12/2011 Document Revised: 05/16/2016 Document Reviewed: 05/16/2016 Elsevier Interactive Patient Education  2019 [place] and Cholesterol Restricted Eating Plan Getting too much fat and cholesterol in your diet may cause health problems. Choosing the right foods helps keep your fat and cholesterol at normal levels. This can keep you from getting certain diseases. Your doctor may recommend an eating plan that includes:  Total fat: ______% or less of total calories a day.  Saturated fat: ______% or less of total calories a day.  Cholesterol: less than _________mg a day.  Fiber: ______g a day. What are tips for following this plan? Meal planning  At meals, divide your plate into four equal parts: ? Fill one-half of your plate with vegetables and green salads. ? Fill one-fourth of your plate with whole grains. ? Fill one-fourth of your plate with low-fat (lean) protein foods.  Eat fish that is high in omega-3 fats at least two times a week. This includes mackerel, tuna, sardines, and salmon.  Eat foods that are high in fiber, such as whole grains, beans, apples, broccoli, carrots, peas, and barley. General tips   Work with your doctor to lose weight if you need to.  Avoid: ? Foods with added sugar. ? Fried foods. ? Foods with partially hydrogenated oils.  Limit alcohol intake to no more than 1 drink a day for nonpregnant women and 2 drinks a day for men. One drink equals 12 oz of beer, 5 oz of wine, or 1 oz of hard liquor. Reading food labels  Check food labels for: ? Trans fats. ? Partially hydrogenated oils. ? Saturated fat (g) in each serving. ? Cholesterol (mg) in each serving. ? Fiber (g) in each serving.  Choose foods with healthy fats, such as: ? Monounsaturated fats. ? Polyunsaturated fats. ? Omega-3 fats.  Choose grain products that have whole grains. Look for the word "whole" as the first word in the ingredient list. Cooking  Cook foods using low-fat methods. These include baking, boiling, grilling, and broiling.  Eat more home-cooked foods. Eat at restaurants  and buffets less often.  Avoid cooking using saturated fats, such as butter, cream, palm oil, palm kernel oil, and coconut oil. Recommended foods  Fruits  All fresh, canned (in natural juice), or frozen fruits. Vegetables  Fresh or frozen vegetables (raw, steamed, roasted, or grilled). Green salads. Grains  Whole grains, such as whole wheat or whole grain breads, crackers, cereals, and pasta. Unsweetened oatmeal, bulgur, barley,  quinoa, or brown rice. Corn or whole wheat flour tortillas. Meats and other protein foods  Ground beef (85% or leaner), grass-fed beef, or beef trimmed of fat. Skinless chicken or [place]. Ground chicken or [place]. Pork trimmed of fat. All fish and seafood. Egg whites. Dried beans, peas, or lentils. Unsalted nuts or seeds. Unsalted canned beans. Nut butters without added sugar or oil. Dairy  Low-fat or nonfat dairy products, such as skim or 1% milk, 2% or reduced-fat cheeses, low-fat and fat-free ricotta or cottage cheese, or plain low-fat and nonfat yogurt. Fats and oils  Tub margarine without trans fats. Light or reduced-fat mayonnaise and salad dressings. Avocado. Olive, canola, sesame, or safflower oils. The items listed above may not be a complete list of foods and beverages you can eat. Contact a dietitian for more information. Foods to avoid Fruits  Canned fruit in heavy syrup. Fruit in cream or butter sauce. Fried fruit. Vegetables  Vegetables cooked in cheese, cream, or butter sauce. Fried vegetables. Grains  White bread. White pasta. White rice. Cornbread. Bagels, pastries, and croissants. Crackers and snack foods that contain trans fat and hydrogenated oils. Meats and other protein foods  Fatty cuts of meat. Ribs, chicken wings, bacon, sausage, bologna, salami, chitterlings, fatback, hot dogs, bratwurst, and packaged lunch meats. Liver and organ meats. Whole eggs and egg yolks. Chicken and [place] with skin. Fried meat. Dairy  Whole or 2% milk,  cream, half-and-half, and cream cheese. Whole milk cheeses. Whole-fat or sweetened yogurt. Full-fat cheeses. Nondairy creamers and whipped toppings. Processed cheese, cheese spreads, and cheese curds. Beverages  Alcohol. Sugar-sweetened drinks such as sodas, lemonade, and fruit drinks. Fats and oils  Butter, stick margarine, lard, shortening, ghee, or bacon fat. Coconut, palm kernel, and palm oils. Sweets and desserts  Corn syrup, sugars, honey, and molasses. Candy. Jam and jelly. Syrup. Sweetened cereals. Cookies, pies, cakes, donuts, muffins, and ice cream. The items listed above may not be a complete list of foods and beverages you should avoid. Contact a dietitian for more information. Summary  Choosing the right foods helps keep your fat and cholesterol at normal levels. This can keep you from getting certain diseases.  At meals, fill one-half of your plate with vegetables and green salads.  Eat high-fiber foods, like whole grains, beans, apples, carrots, peas, and barley.  Limit added sugar, saturated fats, alcohol, and fried foods. This information is not intended to replace advice given to you by your health care provider. Make sure you discuss any questions you have with your health care provider. Document Released: 11/22/2011 Document Revised: 01/24/2018 Document Reviewed: 02/07/2017 Elsevier Interactive Patient Education  2019 [employer].

## 2018-08-09 LAB — COMPLETE METABOLIC PANEL WITH GFR
AG Ratio: 1.8 (calc) (ref 1.0–2.5)
ALKALINE PHOSPHATASE (APISO): 73 U/L (ref 36–130)
ALT: 30 U/L (ref 9–46)
AST: 20 U/L (ref 10–40)
Albumin: 4.8 g/dL (ref 3.6–5.1)
BUN: 16 mg/dL (ref 7–25)
CO2: 22 mmol/L (ref 20–32)
Calcium: 9.5 mg/dL (ref 8.6–10.3)
Chloride: 108 mmol/L (ref 98–110)
Creat: 0.83 mg/dL (ref 0.60–1.35)
GFR, Est African American: 120 mL/min/{1.73_m2} (ref >=60)
GFR, Est Non African American: 103 mL/min/{1.73_m2} (ref >=60)
Globulin: 2.6 g/dL (calc) (ref 1.9–3.7)
Glucose, Bld: 100 mg/dL — ABNORMAL HIGH (ref 65–99)
Potassium: 4.2 mmol/L (ref 3.5–5.3)
Sodium: 140 mmol/L (ref 135–146)
Total Bilirubin: 0.5 mg/dL (ref 0.2–1.2)
Total Protein: 7.4 g/dL (ref 6.1–8.1)

## 2018-08-09 LAB — PSA: PSA: 0.9 ng/mL (ref <=4.0)

## 2018-08-09 LAB — LIPID PANEL
CHOL/HDL RATIO: 5.5 (calc) — AB (ref <5.0)
Cholesterol: 208 mg/dL — ABNORMAL HIGH (ref <200)
HDL: 38 mg/dL — ABNORMAL LOW (ref >=40)
LDL Cholesterol (Calc): 133 mg/dL (calc) — ABNORMAL HIGH
NON-HDL CHOLESTEROL (CALC): 170 mg/dL — AB (ref <130)
Triglycerides: 231 mg/dL — ABNORMAL HIGH (ref <150)

## 2018-08-09 LAB — HEPATITIS C ANTIBODY
Hepatitis C Ab: NONREACTIVE
SIGNAL TO CUT-OFF: 0.02 (ref <1.00)

## 2018-08-09 LAB — HEMOGLOBIN A1C
Hgb A1c MFr Bld: 5.5 % of total Hgb (ref <5.7)
MEAN PLASMA GLUCOSE: 111 (calc)
eAG (mmol/L): 6.2 (calc)

## 2018-08-09 LAB — HIV ANTIBODY (ROUTINE TESTING W REFLEX): HIV 1&2 Ab, 4th Generation: NONREACTIVE

## 2018-08-09 LAB — TSH: TSH: 2.1 mIU/L (ref 0.40–4.50)

## 2018-08-09 LAB — RPR: RPR Ser Ql: NONREACTIVE

## 2018-08-10 ENCOUNTER — Other Ambulatory Visit: Payer: Self-pay | Admitting: Family Medicine

## 2018-08-10 DIAGNOSIS — E782 Mixed hyperlipidemia: Secondary | ICD-10-CM

## 2018-08-10 MED ORDER — ROSUVASTATIN CALCIUM 10 MG PO TABS: 10.0000 mg | ORAL_TABLET | Freq: Every day | ORAL | 3 refills | Status: AC

## 2018-08-13 ENCOUNTER — Telehealth: Payer: Self-pay

## 2018-08-13 NOTE — Telephone Encounter (Signed)
Call to patient to see about scheduling his surgery with Dr Dahlia Byes. Patient's surgery has been scheduled for at [facility] with Dr. Dahlia Byes on 09/04/18.  The patient is aware he will be contacted by the [facility] to complete a phone interview sometime in the near future.  The patient is aware to call the office should he have further questions.   I will call him and let him know if he needs to be seen by Dr Dahlia Byes prior to surgery.

## 2018-08-14 ENCOUNTER — Telehealth: Payer: Self-pay | Admitting: Surgery

## 2018-08-14 NOTE — Telephone Encounter (Signed)
Pt advised of pre op date/time and sx date. Sx: 09/04/18 with Dr Kris Mouton assisted laparoscopic cholecystectomy.  Pre op: 08/24/18 between 1-5:00pm-phone interview.   Patient made aware to call [phone], between 1-3:00pm the day before surgery, to find out what time to arrive.   Patient also stated that he is having his colonoscopy with Dr Marius Ditch on 08/20/18.

## 2018-08-19 MED ORDER — DEXTROSE 5 % IV SOLN
3.0000 g | INTRAVENOUS | Status: DC
  Filled 2018-08-19: qty 3000

## 2018-08-20 ENCOUNTER — Ambulatory Visit: Payer: 59 | Admitting: Anesthesiology

## 2018-08-20 ENCOUNTER — Other Ambulatory Visit: Payer: Self-pay

## 2018-08-20 ENCOUNTER — Ambulatory Visit
Admission: RE | Admit: 2018-08-20 | Discharge: 2018-08-20 | Disposition: A | Discharge status: Home / Self Care | Payer: 59 | Attending: Gastroenterology | Admitting: Gastroenterology

## 2018-08-20 ENCOUNTER — Encounter: Payer: Self-pay | Admitting: *Deleted

## 2018-08-20 ENCOUNTER — Encounter
Admission: RE | Disposition: A | Discharge status: Home / Self Care | Payer: Self-pay | Source: Home / Self Care | Attending: Gastroenterology

## 2018-08-20 DIAGNOSIS — Z791 Long term (current) use of non-steroidal anti-inflammatories (NSAID): Secondary | ICD-10-CM | POA: Diagnosis not present

## 2018-08-20 DIAGNOSIS — Z6839 Body mass index (BMI) 39.0-39.9, adult: Secondary | ICD-10-CM | POA: Insufficient documentation

## 2018-08-20 DIAGNOSIS — Z79899 Other long term (current) drug therapy: Secondary | ICD-10-CM | POA: Diagnosis not present

## 2018-08-20 DIAGNOSIS — I10 Essential (primary) hypertension: Secondary | ICD-10-CM | POA: Insufficient documentation

## 2018-08-20 DIAGNOSIS — D124 Benign neoplasm of descending colon: Secondary | ICD-10-CM | POA: Diagnosis not present

## 2018-08-20 DIAGNOSIS — Z1211 Encounter for screening for malignant neoplasm of colon: Secondary | ICD-10-CM

## 2018-08-20 DIAGNOSIS — Z800 Family history of malignant neoplasm of digestive organs: Secondary | ICD-10-CM | POA: Diagnosis not present

## 2018-08-20 DIAGNOSIS — D126 Benign neoplasm of colon, unspecified: Secondary | ICD-10-CM | POA: Diagnosis not present

## 2018-08-20 DIAGNOSIS — E785 Hyperlipidemia, unspecified: Secondary | ICD-10-CM | POA: Diagnosis not present

## 2018-08-20 DIAGNOSIS — F17210 Nicotine dependence, cigarettes, uncomplicated: Secondary | ICD-10-CM | POA: Insufficient documentation

## 2018-08-20 DIAGNOSIS — K635 Polyp of colon: Secondary | ICD-10-CM | POA: Diagnosis not present

## 2018-08-20 DIAGNOSIS — K8050 Calculus of bile duct without cholangitis or cholecystitis without obstruction: Secondary | ICD-10-CM

## 2018-08-20 HISTORY — DX: Hyperlipidemia, unspecified: E78.5

## 2018-08-20 HISTORY — PX: COLONOSCOPY WITH PROPOFOL: SHX5780

## 2018-08-20 SURGERY — COLONOSCOPY WITH PROPOFOL
Anesthesia: General

## 2018-08-20 MED ORDER — PROPOFOL 500 MG/50ML IV EMUL
INTRAVENOUS | Status: AC
  Filled 2018-08-20: qty 50

## 2018-08-20 MED ORDER — PROPOFOL 10 MG/ML IV BOLUS
INTRAVENOUS | Status: DC | PRN
  Administered 2018-08-20: 70 mg via INTRAVENOUS
  Administered 2018-08-20: 40 mg via INTRAVENOUS
  Administered 2018-08-20: 30 mg via INTRAVENOUS

## 2018-08-20 MED ORDER — LIDOCAINE HCL (CARDIAC) PF 100 MG/5ML IV SOSY
PREFILLED_SYRINGE | INTRAVENOUS | Status: DC | PRN
  Administered 2018-08-20: 100 mg via INTRAVENOUS

## 2018-08-20 MED ORDER — SODIUM CHLORIDE 0.9 % IV SOLN
INTRAVENOUS | Status: DC
  Administered 2018-08-20: 09:00:00 via INTRAVENOUS

## 2018-08-20 MED ORDER — PROPOFOL 500 MG/50ML IV EMUL
INTRAVENOUS | Status: DC | PRN
  Administered 2018-08-20: 100 ug/kg/min via INTRAVENOUS

## 2018-08-20 MED ORDER — PROPOFOL 10 MG/ML IV BOLUS
INTRAVENOUS | Status: AC
  Filled 2018-08-20: qty 80

## 2018-08-20 NOTE — Anesthesia Post-op Follow-up Note (Signed)
Anesthesia QCDR form completed.        

## 2018-08-20 NOTE — H&P (Signed)
Cephas Darby, MD [address]  [place], [place] [address]  Main: [phone]  Fax: [phone] Pager: [phone]  Primary Care Physician:  Hubbard Hartshorn, FNP Primary Gastroenterologist:  Dr. Cephas Darby  Pre-Procedure History & Physical: HPI:  Dominic Gross is a 48 y.o. male is here for an colonoscopy.   Past Medical History:  Diagnosis Date  . Hyperlipemia   . Hypertension     Past Surgical History:  Procedure Laterality Date  . KNEE ARTHROSCOPY W/ MENISCECTOMY    . KNEE SURGERY Left     Prior to Admission medications   Medication Sig Start Date End Date Taking? Authorizing Provider  amLODipine (NORVASC) 5 MG tablet Take 1 tablet (5 mg total) by mouth daily. 08/08/18 08/08/19 Yes Hubbard Hartshorn, FNP  naproxen (NAPROSYN) 500 MG tablet Take 1 tablet (500 mg total) by mouth 2 (two) times daily with a meal. 07/30/18 07/30/19 Yes Merlyn Lot, MD  promethazine (PHENERGAN) 12.5 MG tablet Take 1 tablet (12.5 mg total) by mouth every 6 (six) hours as needed for nausea or vomiting. 07/30/18  Yes Merlyn Lot, MD  rosuvastatin (CRESTOR) 10 MG tablet Take 1 tablet (10 mg total) by mouth daily. 08/10/18  Yes Hubbard Hartshorn, FNP  SUPREP BOWEL PREP KIT 17.5-3.13-1.6 GM/177ML SOLN  08/02/18   [provider]    Allergies as of 08/02/2018  . (No Known Allergies)    Family History  Problem Relation Age of Onset  . Colon cancer Mother   . Colon cancer Father     Social History   Socioeconomic History  . Marital status: Divorced    Spouse name: Not on file  . Number of children: 1  . Years of education: Not on file  . Highest education level: Not on file  Occupational History  . Not on file  Social Needs  . Financial resource strain: Not hard at all  . Food insecurity:    Worry: Never true    Inability: Never true  . Transportation needs:    Medical: No    Non-medical: No  Tobacco Use  . Smoking status: Current Every Day Smoker   Packs/day: 1.00    Years: 19.00    Pack years: 19.00    Types: Cigarettes  . Smokeless tobacco: Never Used  Substance and Sexual Activity  . Alcohol use: Never    Frequency: Never  . Drug use: Never  . Sexual activity: Yes    Partners: Female  Lifestyle  . Physical activity:    Days per week: 0 days    Minutes per session: 0 min  . Stress: Not at all  Relationships  . Social connections:    Talks on phone: More than three times a week    Gets together: More than three times a week    Attends religious service: Never    Active member of club or organization: No    Attends meetings of clubs or organizations: Never    Relationship status: Living with partner  . Intimate partner violence:    Fear of current or ex partner: No    Emotionally abused: No    Physically abused: No    Forced sexual activity: No  Other Topics Concern  . Not on file  Social History Narrative  . Not on file    Review of Systems: See HPI, otherwise negative ROS  Physical Exam: BP (!) 129/103   Pulse 93   Temp 98.4 F (36.9 C) (  Oral)   Resp 18   Ht 6' (1.829 m)   Wt 132.5 kg   SpO2 99%   BMI 39.60 kg/m  General:   Alert,  pleasant and cooperative in NAD Head:  Normocephalic and atraumatic. Neck:  Supple; no masses or thyromegaly. Lungs:  Clear throughout to auscultation.    Heart:  Regular rate and rhythm. Abdomen:  Soft, nontender and nondistended. Normal bowel sounds, without guarding, and without rebound.   Neurologic:  Alert and  oriented x4;  grossly normal neurologically.  Impression/Plan: Dominic Gross is here for an colonoscopy to be performed for colon cancer screening  Risks, benefits, limitations, and alternatives regarding  colonoscopy have been reviewed with the patient.  Questions have been answered.  All parties agreeable.   Sherri Sear, MD  08/20/2018, 9:28 AM

## 2018-08-20 NOTE — Transfer of Care (Signed)
Immediate Anesthesia Transfer of Care Note  Patient: Dominic Gross  Procedure(s) Performed: COLONOSCOPY WITH PROPOFOL (N/A )  Patient Location: PACU and Endoscopy Unit  Anesthesia Type:General  Level of Consciousness: awake, oriented and patient cooperative  Airway & Oxygen Therapy: Patient Spontanous Breathing and Patient connected to nasal cannula oxygen  Post-op Assessment: Report given to RN, Post -op Vital signs reviewed and stable and Patient moving all extremities  Post vital signs: Reviewed and stable  Last Vitals:  Vitals Value Taken Time  BP 115/81 08/20/2018 10:14 AM  Temp 36.1 C 08/20/2018 10:14 AM  Pulse 89 08/20/2018 10:15 AM  Resp 19 08/20/2018 10:15 AM  SpO2 96 % 08/20/2018 10:15 AM  Vitals shown include unvalidated device data.  Last Pain:  Vitals:   08/20/18 1014  TempSrc: Tympanic  PainSc: 0-No pain         Complications: No apparent anesthesia complications

## 2018-08-20 NOTE — Op Note (Signed)
[facility] Gastroenterology Patient Name: Dominic Gross Procedure Date: 08/20/2018 9:35 AM MRN: [id number] Account #: [id number] Date of Birth: 11-Nov-1970 Admit Type: Outpatient Age: 48 Room: [facility] ENDO ROOM 3 Gender: Male Note Status: Finalized Procedure:            Colonoscopy Indications:          Screening in patient at increased risk: Colorectal                        cancer in mother before age 55, Screening in patient at                        increased risk: Colorectal cancer in father before age                        30 Providers:            Kenyetta Fife Raeanne Gathers MD, MD Referring MD:         Raelyn Ensign, FNP Medicines:            Monitored Anesthesia Care Complications:        No immediate complications. Estimated blood loss: None. Procedure:            Pre-Anesthesia Assessment:                       - Prior to the procedure, a History and Physical was                        performed, and patient medications and allergies were                        reviewed. The patient is competent. The risks and                        benefits of the procedure and the sedation options and                        risks were discussed with the patient. All questions                        were answered and informed consent was obtained.                        Patient identification and proposed procedure were                        verified by the physician, the nurse, the                        anesthesiologist, the anesthetist and the technician in                        the pre-procedure area in the procedure room in the                        endoscopy suite. Mental Status Examination: alert and                        oriented. Airway Examination: normal oropharyngeal  airway and neck mobility. Respiratory Examination:                        clear to auscultation. CV Examination: normal.                        Prophylactic Antibiotics: The patient  does not require                        prophylactic antibiotics. Prior Anticoagulants: The                        patient has taken no previous anticoagulant or                        antiplatelet agents. ASA Grade Assessment: III - A                        patient with severe systemic disease. After reviewing                        the risks and benefits, the patient was deemed in                        satisfactory condition to undergo the procedure. The                        anesthesia plan was to use monitored anesthesia care                        (MAC). Immediately prior to administration of                        medications, the patient was re-assessed for adequacy                        to receive sedatives. The heart rate, respiratory rate,                        oxygen saturations, blood pressure, adequacy of                        pulmonary ventilation, and response to care were                        monitored throughout the procedure. The physical status                        of the patient was re-assessed after the procedure.                       After obtaining informed consent, the colonoscope was                        passed under direct vision. Throughout the procedure,                        the patient's blood pressure, pulse, and oxygen  saturations were monitored continuously. The                        Colonoscope was introduced through the anus and                        advanced to the the cecum, identified by appendiceal                        orifice and ileocecal valve. The colonoscopy was                        performed without difficulty. The patient tolerated the                        procedure well. The quality of the bowel preparation                        was evaluated using the BBPS [facility] Bowel Preparation                        Scale) with scores of: Right Colon = 3, Transverse                        Colon = 3 and Left  Colon = 3 (entire mucosa seen well                        with no residual staining, small fragments of stool or                        opaque liquid). The total BBPS score equals 9. Findings:      The perianal and digital rectal examinations were normal. Pertinent       negatives include normal sphincter tone and no palpable rectal lesions.      A 8 mm polyp was found in the descending colon. The polyp was sessile.       The polyp was removed with a hot snare. Resection and retrieval were       complete.      The retroflexed view of the distal rectum and anal verge was normal and       showed no anal or rectal abnormalities.      The exam was otherwise without abnormality. Impression:           - One 8 mm polyp in the descending colon, removed with                        a hot snare. Resected and retrieved.                       - The distal rectum and anal verge are normal on                        retroflexion view.                       - The examination was otherwise normal. Recommendation:       - Discharge patient to home (with escort).                       -  Resume previous diet today.                       - Continue present medications.                       - Await pathology results.                       - Repeat colonoscopy in 3 years for surveillance given                        adenomatous polyp and two 1st degree relatives with                        colon cancer. 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 All rights reserved. The codes documented in this  report are preliminary and upon coder review may  be revised to meet current compliance requirements. Dr. Ulyess Mort Lin Landsman MD, MD 08/20/2018 10:10:57 AM This report has been signed electronically. Number of Addenda: 0 Note Initiated On: 08/20/2018 9:35 AM Scope Withdrawal Time: 0 hours 22 minutes 26 seconds  Total Procedure Duration: 0 hours 24 minutes 54 seconds       [facility]

## 2018-08-20 NOTE — Anesthesia Postprocedure Evaluation (Signed)
Anesthesia Post Note  Patient: Dominic Gross  Procedure(s) Performed: COLONOSCOPY WITH PROPOFOL (N/A )  Patient location during evaluation: PACU Anesthesia Type: General Level of consciousness: awake and alert Pain management: pain level controlled Vital Signs Assessment: post-procedure vital signs reviewed and stable Respiratory status: spontaneous breathing, nonlabored ventilation and respiratory function stable Cardiovascular status: blood pressure returned to baseline and stable Postop Assessment: no apparent nausea or vomiting Anesthetic complications: no     Last Vitals:  Vitals:   08/20/18 0846 08/20/18 1014  BP: (!) 129/103 115/81  Pulse: 93   Resp: 18   Temp: 36.9 C (!) 36.1 C  SpO2: 99%     Last Pain:  Vitals:   08/20/18 1031  TempSrc:   PainSc: 0-No pain                 Durenda Hurt

## 2018-08-20 NOTE — Anesthesia Preprocedure Evaluation (Addendum)
Anesthesia Evaluation  Patient identified by MRN, date of birth, ID band Patient awake    Reviewed: Allergy & Precautions, H&P , NPO status , Patient's Chart, lab work & pertinent test results  Airway Mallampati: II  TM Distance: >3 FB    Comment: beard Dental  (+) Poor Dentition   Pulmonary Current Smoker,           Cardiovascular hypertension,      Neuro/Psych negative neurological ROS  negative psych ROS   GI/Hepatic negative GI ROS, Neg liver ROS,   Endo/Other  Morbid obesity  Renal/GU negative Renal ROS  negative genitourinary   Musculoskeletal   Abdominal   Peds  Hematology negative hematology ROS (+)   Anesthesia Other Findings Past Medical History: No date: Hyperlipemia No date: Hypertension  Past Surgical History: No date: KNEE ARTHROSCOPY W/ MENISCECTOMY No date: KNEE SURGERY; Left  BMI    Body Mass Index:  39.60 kg/m      Reproductive/Obstetrics negative OB ROS                            Anesthesia Physical Anesthesia Plan  ASA: III  Anesthesia Plan: General   Post-op Pain Management:    Induction:   PONV Risk Score and Plan: Propofol infusion and TIVA  Airway Management Planned: Nasal Cannula  Additional Equipment:   Intra-op Plan:   Post-operative Plan:   Informed Consent: I have reviewed the patients History and Physical, chart, labs and discussed the procedure including the risks, benefits and alternatives for the proposed anesthesia with the patient or authorized representative who has indicated his/her understanding and acceptance.     Dental Advisory Given  Plan Discussed with: Anesthesiologist and CRNA  Anesthesia Plan Comments:         Anesthesia Quick Evaluation

## 2018-08-21 LAB — SURGICAL PATHOLOGY

## 2018-08-23 ENCOUNTER — Encounter: Payer: Self-pay | Admitting: Gastroenterology

## 2018-08-23 ENCOUNTER — Telehealth: Payer: Self-pay | Admitting: *Deleted

## 2018-08-23 NOTE — Telephone Encounter (Signed)
C/x surgery, will call back to reschedule.

## 2018-08-24 ENCOUNTER — Inpatient Hospital Stay: Admission: RE | Admit: 2018-08-24 | Payer: 59 | Source: Ambulatory Visit

## 2018-09-04 ENCOUNTER — Ambulatory Visit: Admit: 2018-09-04 | Payer: 59 | Admitting: Surgery

## 2018-09-04 ENCOUNTER — Ambulatory Visit: Payer: Self-pay | Admitting: Family Medicine

## 2018-09-04 SURGERY — ROBOTIC ASSISTED LAPAROSCOPIC CHOLECYSTECTOMY
Anesthesia: General

## 2018-09-07 ENCOUNTER — Encounter: Payer: 59 | Admitting: Family Medicine

## 2018-10-05 ENCOUNTER — Other Ambulatory Visit: Payer: Self-pay

## 2018-10-05 ENCOUNTER — Ambulatory Visit: Payer: 59 | Admitting: Licensed Clinical Social Worker

## 2018-10-05 NOTE — Progress Notes (Signed)
Called patient to conduct pre-screening with him before his 9:00 appointment. He did not answer. His phone went to voicemail so I left a message stating that I was calling him to do his pre-screening before his appointment and he could call me back if he gets the message before 9:00am.

## 2018-10-10 ENCOUNTER — Ambulatory Visit: Payer: 59 | Admitting: Psychiatry

## 2018-10-16 ENCOUNTER — Telehealth: Payer: Self-pay | Admitting: *Deleted

## 2018-10-16 NOTE — Telephone Encounter (Signed)
Message left for patient to call the office.   We need to see if he would like to have surgery at [facility] since restrictions have not been lifted yet for [facility] due to COVID-19.   If so, we need to arrange for a pre-op with Dr. Dahlia Byes.   Patient will also require COVID-19 testing prior.

## 2018-10-24 NOTE — Telephone Encounter (Signed)
Another message left for patient to call the office.

## 2018-11-06 ENCOUNTER — Other Ambulatory Visit: Payer: Self-pay

## 2018-11-06 ENCOUNTER — Encounter

## 2018-11-06 ENCOUNTER — Ambulatory Visit: Payer: 59 | Admitting: Psychiatry

## 2018-11-26 ENCOUNTER — Encounter: Payer: Self-pay | Admitting: *Deleted

## 2018-11-26 NOTE — Telephone Encounter (Signed)
Message left for patient to call the office to reschedule surgery that was previously postponed due to COVID-19.   Letter mailed.  Note routed to Dr. Dahlia Byes.

## 2019-01-15 ENCOUNTER — Telehealth: Payer: Self-pay | Admitting: Surgery

## 2019-01-15 NOTE — Telephone Encounter (Signed)
I have called patient to discuss rescheduling surgery that was cancelled in March 2020 with Dr Kris Mouton assisted laparoscopic cholecystectomy. No answer. I have left a detailed message on patient's VM.

## 2019-04-06 ENCOUNTER — Ambulatory Visit: Admit: 2019-04-06 | Payer: 59 | Admitting: Surgery

## 2019-04-06 SURGERY — CHOLECYSTECTOMY, ROBOT-ASSISTED, LAPAROSCOPIC
Anesthesia: General

## 2019-07-08 ENCOUNTER — Emergency Department: Payer: 59

## 2019-07-08 ENCOUNTER — Other Ambulatory Visit: Payer: Self-pay

## 2019-07-08 ENCOUNTER — Emergency Department
Admission: EM | Admit: 2019-07-08 | Discharge: 2019-07-08 | Disposition: A | Discharge status: Home / Self Care | Payer: 59 | Attending: Emergency Medicine | Admitting: Emergency Medicine

## 2019-07-08 ENCOUNTER — Encounter: Payer: Self-pay | Admitting: Emergency Medicine

## 2019-07-08 DIAGNOSIS — Y999 Unspecified external cause status: Secondary | ICD-10-CM | POA: Diagnosis not present

## 2019-07-08 DIAGNOSIS — S20211A Contusion of right front wall of thorax, initial encounter: Secondary | ICD-10-CM | POA: Insufficient documentation

## 2019-07-08 DIAGNOSIS — Z7982 Long term (current) use of aspirin: Secondary | ICD-10-CM | POA: Insufficient documentation

## 2019-07-08 DIAGNOSIS — Y939 Activity, unspecified: Secondary | ICD-10-CM | POA: Diagnosis not present

## 2019-07-08 DIAGNOSIS — I10 Essential (primary) hypertension: Secondary | ICD-10-CM | POA: Diagnosis not present

## 2019-07-08 DIAGNOSIS — W000XXA Fall on same level due to ice and snow, initial encounter: Secondary | ICD-10-CM | POA: Insufficient documentation

## 2019-07-08 DIAGNOSIS — S299XXA Unspecified injury of thorax, initial encounter: Secondary | ICD-10-CM | POA: Diagnosis present

## 2019-07-08 DIAGNOSIS — Z79899 Other long term (current) drug therapy: Secondary | ICD-10-CM | POA: Insufficient documentation

## 2019-07-08 DIAGNOSIS — F17210 Nicotine dependence, cigarettes, uncomplicated: Secondary | ICD-10-CM | POA: Insufficient documentation

## 2019-07-08 DIAGNOSIS — Y929 Unspecified place or not applicable: Secondary | ICD-10-CM | POA: Insufficient documentation

## 2019-07-08 DIAGNOSIS — W19XXXA Unspecified fall, initial encounter: Secondary | ICD-10-CM

## 2019-07-08 MED ORDER — HYDROCODONE-ACETAMINOPHEN 5-325 MG PO TABS: 1.0000 | ORAL_TABLET | Freq: Four times a day (QID) | ORAL | 0 refills | Status: AC | PRN

## 2019-07-08 MED ORDER — IBUPROFEN 600 MG PO TABS: 600.0000 mg | ORAL_TABLET | Freq: Three times a day (TID) | ORAL | 0 refills | Status: AC | PRN

## 2019-07-08 NOTE — ED Triage Notes (Addendum)
Pt in via POV, reports fall last Thursday, states he slipped down a flight of wooden steps, complaints of right side mid to upper back pain since the fall.  Denies hitting head, denies LOC at time of fall.  Taking OTC medication without much relief.  Ambulatory to triage, NAD noted at this time.

## 2019-07-08 NOTE — ED Provider Notes (Signed)
[facility] Emergency Department Provider Note   ____________________________________________   First MD Initiated Contact with Patient 07/08/19 1240     (approximate)  I have reviewed the triage vital signs and the nursing notes.   HISTORY  Chief Complaint Fall   HPI Dominic Gross is a 49 y.o. male presents to the ED with complaint of right sided rib pain.  Patient states that he slipped on icy steps last week and has continued to have pain in that area since.  Patient denies any injury to his head or LOC.  He has been taking over-the-counter medication without any relief.  Patient also states that pain is increased with deep inspiration or cough.  He denies any fever or chills.  He rates his pain as an 8 out of 10.       Past Medical History:  Diagnosis Date  . Hyperlipemia   . Hypertension     Patient Active Problem List   Diagnosis Date Noted  . Family history of colon cancer in mother   . Family history of colon cancer in father   . Colon cancer screening   . Mood disorder ([place]) 08/08/2018  . Class 3 severe obesity due to excess calories with serious comorbidity and body mass index (BMI) of 40.0 to 44.9 in adult ([place]) 08/08/2018  . Calculus of gallbladder without cholecystitis without obstruction 08/08/2018  . Family history of colorectal cancer 08/08/2018  . Essential hypertension 01/18/2017    Past Surgical History:  Procedure Laterality Date  . COLONOSCOPY WITH PROPOFOL N/A 08/20/2018   Procedure: COLONOSCOPY WITH PROPOFOL;  Surgeon: Lin Landsman, MD;  Location: [facility] ENDOSCOPY;  Service: Gastroenterology;  Laterality: N/A;  . KNEE ARTHROSCOPY W/ MENISCECTOMY    . KNEE SURGERY Left     Prior to Admission medications   Medication Sig Start Date End Date Taking? Authorizing Provider  amLODipine (NORVASC) 5 MG tablet Take 1 tablet (5 mg total) by mouth daily. 08/08/18 08/08/19  Hubbard Hartshorn, FNP  aspirin 325 MG tablet Take 325 mg by  mouth daily.    [provider]  Cyanocobalamin (VITAMIN B-12 PO) Take 1 tablet by mouth daily.    [provider]  Ginkgo Biloba (GNP GINGKO BILOBA EXTRACT PO) Take 1 capsule by mouth daily.    [provider]  HYDROcodone-acetaminophen (NORCO/VICODIN) 5-325 MG tablet Take 1 tablet by mouth every 6 (six) hours as needed for severe pain. 07/08/19   Johnn Hai, PA-C  ibuprofen (ADVIL) 600 MG tablet Take 1 tablet (600 mg total) by mouth every 8 (eight) hours as needed. 07/08/19   Johnn Hai, PA-C  Multiple Vitamin (MULTIVITAMIN WITH MINERALS) TABS tablet Take 1 tablet by mouth daily.    [provider]  promethazine (PHENERGAN) 12.5 MG tablet Take 1 tablet (12.5 mg total) by mouth every 6 (six) hours as needed for nausea or vomiting. 07/30/18   Merlyn Lot, MD  rosuvastatin (CRESTOR) 10 MG tablet Take 1 tablet (10 mg total) by mouth daily. 08/10/18   Hubbard Hartshorn, FNP    Allergies Patient has no known allergies.  Family History  Problem Relation Age of Onset  . Colon cancer Mother   . Colon cancer Father     Social History Social History   Tobacco Use  . Smoking status: Current Every Day Smoker    Packs/day: 1.00    Years: 19.00    Pack years: 19.00    Types: Cigarettes  . Smokeless tobacco:  Never Used  Substance Use Topics  . Alcohol use: Never  . Drug use: Never    Review of Systems Constitutional: No fever/chills Eyes: No visual changes. ENT: No injury. Cardiovascular: Denies chest pain. Respiratory: Denies shortness of breath. Gastrointestinal: No abdominal pain.  No nausea, no vomiting.   Musculoskeletal: Positive for right lateral rib pain. Skin: Negative for rash. Neurological: Negative for headaches, focal weakness or numbness. ____________________________________________   PHYSICAL EXAM:  VITAL SIGNS: ED Triage Vitals  Enc Vitals Group     BP 07/08/19 1131 (!) 166/111     Pulse Rate 07/08/19 1131 97      Resp 07/08/19 1131 17     Temp 07/08/19 1131 98.2 F (36.8 C)     Temp Source 07/08/19 1131 Oral     SpO2 07/08/19 1131 96 %     Weight 07/08/19 1132 274 lb (124.3 kg)     Height 07/08/19 1132 6' (1.829 m)     Head Circumference --      Peak Flow --      Pain Score 07/08/19 1132 8     Pain Loc --      Pain Edu? --      Excl. in [place]? --     Constitutional: Alert and oriented. Well appearing and in no acute distress. Eyes: Conjunctivae are normal.  Head: Atraumatic. Neck: No stridor.   Cardiovascular: Normal rate, regular rhythm. Grossly normal heart sounds.  Good peripheral circulation. Respiratory: Normal respiratory effort.  No retractions. Lungs CTAB.  On examination of the right ribs there is no gross deformity however there is moderate tenderness on palpation right lateral ninth, 10th and 11th area.  Skin is intact and no discoloration is noted in the area. Gastrointestinal: Soft and nontender. No distention. Musculoskeletal: Examination of the back there i is not any tenderness noted while palpating the thoracic and lumbar spine.  There is an area over the right posterior hip area which is nontender but does have some resolving ecchymosis.  Skin is still intact.  Patient is able to stand and ambulate without any assistance. Neurologic:  Normal speech and language. No gross focal neurologic deficits are appreciated. No gait instability. Skin:  Skin is warm, dry and intact.  Psychiatric: Mood and affect are normal. Speech and behavior are normal.  ____________________________________________   LABS (all labs ordered are listed, but only abnormal results are displayed)  Labs Reviewed - No data to display RADIOLOGY   Official radiology report(s): DG Ribs Unilateral W/Chest Right  Result Date: 07/08/2019 CLINICAL DATA:  Right mid to upper back pain since falling 4 days ago. EXAM: RIGHT RIBS AND CHEST - 3+ VIEW COMPARISON:  None. FINDINGS: The heart size and mediastinal contours  are normal. There is mild atelectasis at both lung bases. No pleural effusion or pneumothorax. A metallic BB was placed over the area of pain along the right inferolateral costal margin. No underlying rib fracture or focal rib lesion identified. There are mild degenerative changes within the spine. IMPRESSION: No evidence of right rib fracture, pleural effusion or pneumothorax. Mild bibasilar atelectasis. Electronically Signed   By: Richardean Sale M.D.   On: 07/08/2019 11:58    ____________________________________________   PROCEDURES  Procedure(s) performed (including Critical Care):  Procedures   ____________________________________________   INITIAL IMPRESSION / ASSESSMENT AND PLAN / ED COURSE  As part of my medical decision making, I reviewed the following data within the electronic MEDICAL RECORD NUMBER Notes from prior ED visits and [employer] Controlled  Substance Database  49 year old male presents to the ED with complaint of right lateral rib pain for 1 week.  Patient states that he fell at home on some icy steps and is continued to have pain since that time.  He has been taking over-the-counter medication without any relief.  Physical exam does not show any deformity in the rib area but there is an ecchymotic area on the right lower posterior hip area.  Patient is able to stand and ambulate without any assistance.  X-rays were negative for any rib fractures.  Patient was reassured.  He was given a prescription for hydrocodone and ibuprofen to take as needed.  Patient is encouraged to use ice to his ribs as needed for discomfort.  He will follow-up with his PCP if any continued problems or return to the emergency department if any severe worsening of his symptoms.  ____________________________________________   FINAL CLINICAL IMPRESSION(S) / ED DIAGNOSES  Final diagnoses:  Contusion of ribs, right, initial encounter  Fall in home, initial encounter     ED Discharge Orders          Ordered    HYDROcodone-acetaminophen (NORCO/VICODIN) 5-325 MG tablet  Every 6 hours PRN     07/08/19 1251    ibuprofen (ADVIL) 600 MG tablet  Every 8 hours PRN     07/08/19 1251           Note:  This document was prepared using Dragon voice recognition software and may include unintentional dictation errors.    Johnn Hai, PA-C 07/08/19 1507    Vanessa McMurray, MD 07/09/19 1054

## 2019-07-08 NOTE — Discharge Instructions (Signed)
Follow-up with your primary care provider if any continued problems.  Ice to your ribs as needed for discomfort.  You can expect to be sore for approximately 3 to 4 weeks with rib pain.  Begin taking ibuprofen every 8 hours as needed for mild to moderate pain.  The hydrocodone with acetaminophen is for severe pain and should not be taken while you are driving or operating machinery.

## 2019-07-08 NOTE — ED Notes (Signed)
See triage note  Presents with pain to right upper back and lateral rib area  States he took a fall last week

## 2019-07-14 ENCOUNTER — Other Ambulatory Visit: Payer: Self-pay | Admitting: Emergency Medicine

## 2021-02-16 IMAGING — CR DG RIBS W/ CHEST 3+V*R*
1 series · 3 of 3 positions shown · non-contrast
Comparison: None.

CLINICAL DATA: Right mid to upper back pain since falling 4 days
ago.

EXAM:
RIGHT RIBS AND CHEST - 3+ VIEW

[Series 1: dg ribs unilateral w/chest right · 0.14mm/px · 3 of 3 slices shown]
[im 1/3]
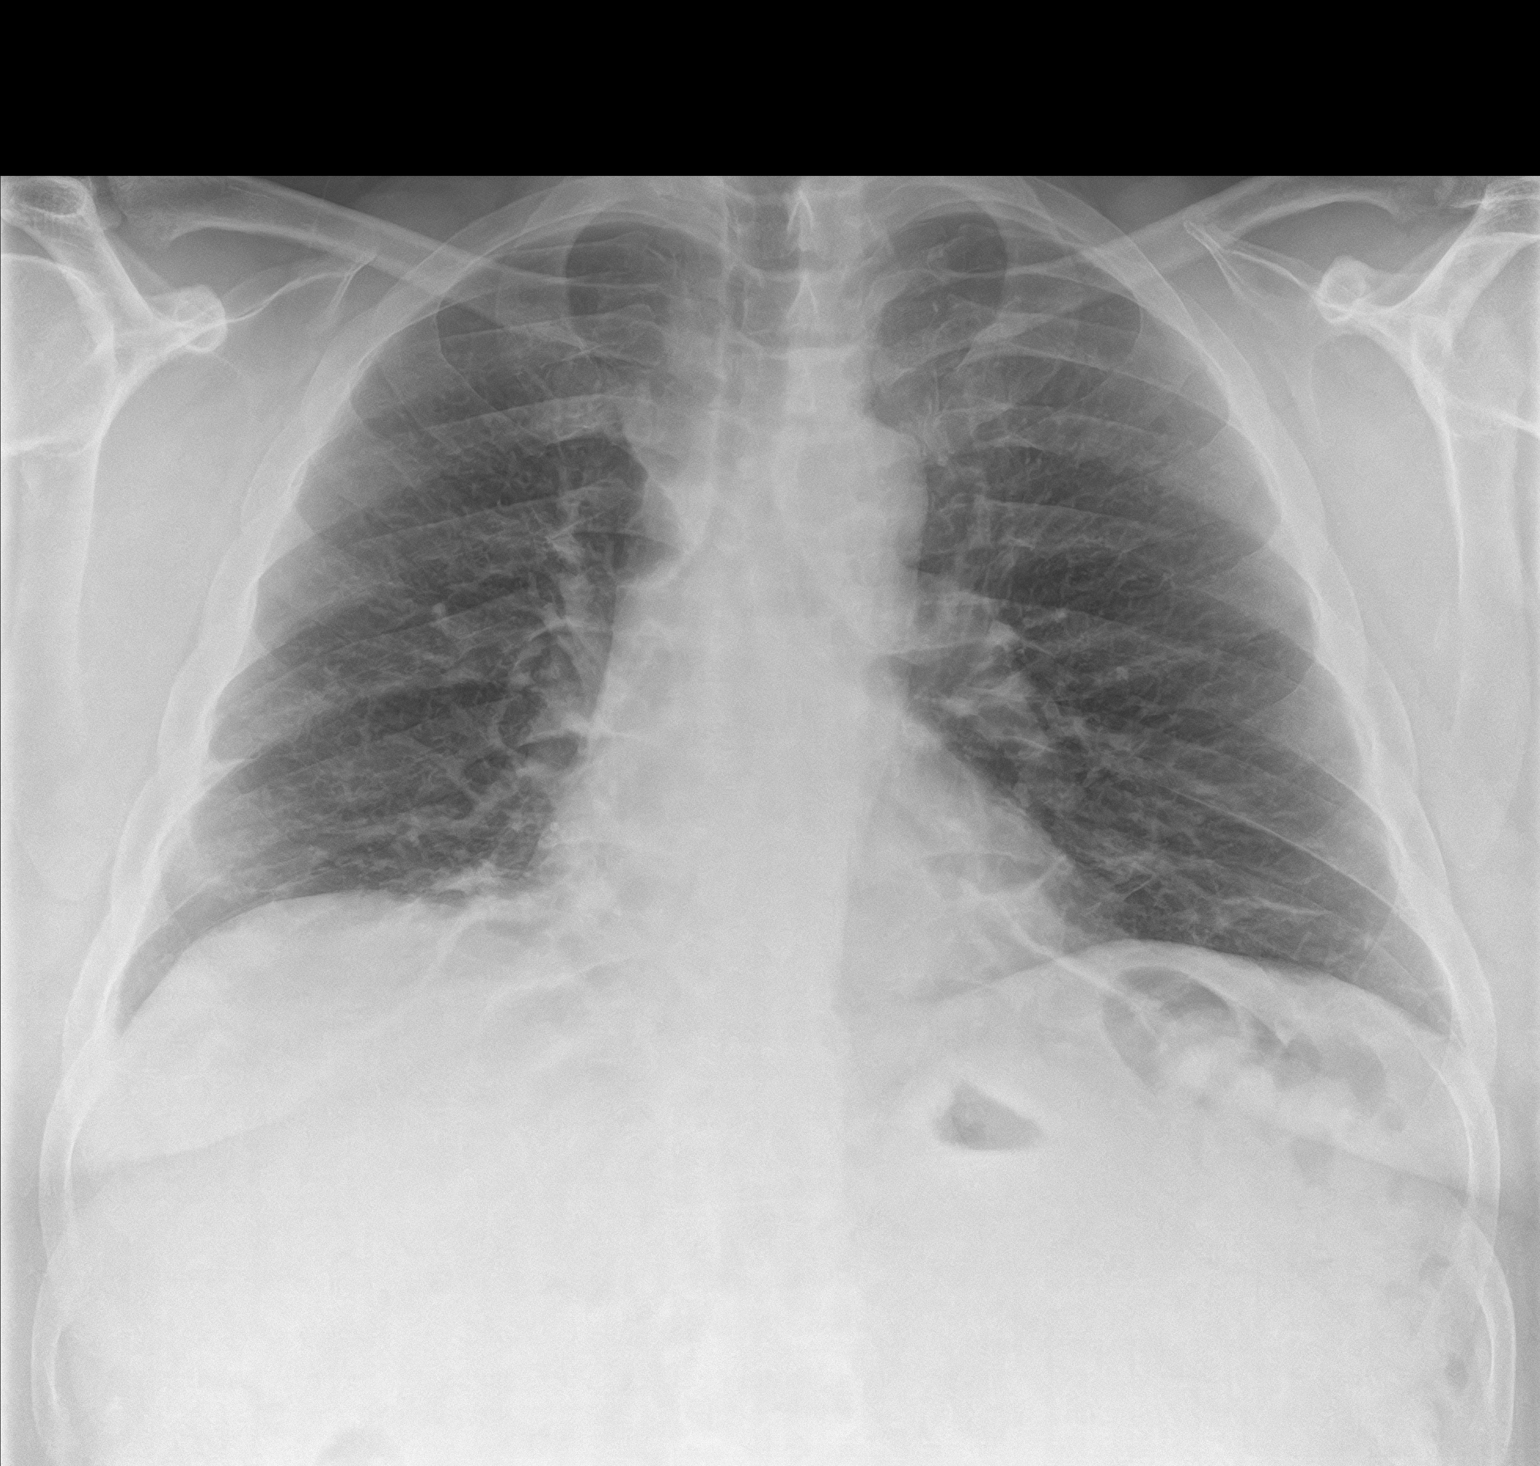
[im 2/3]
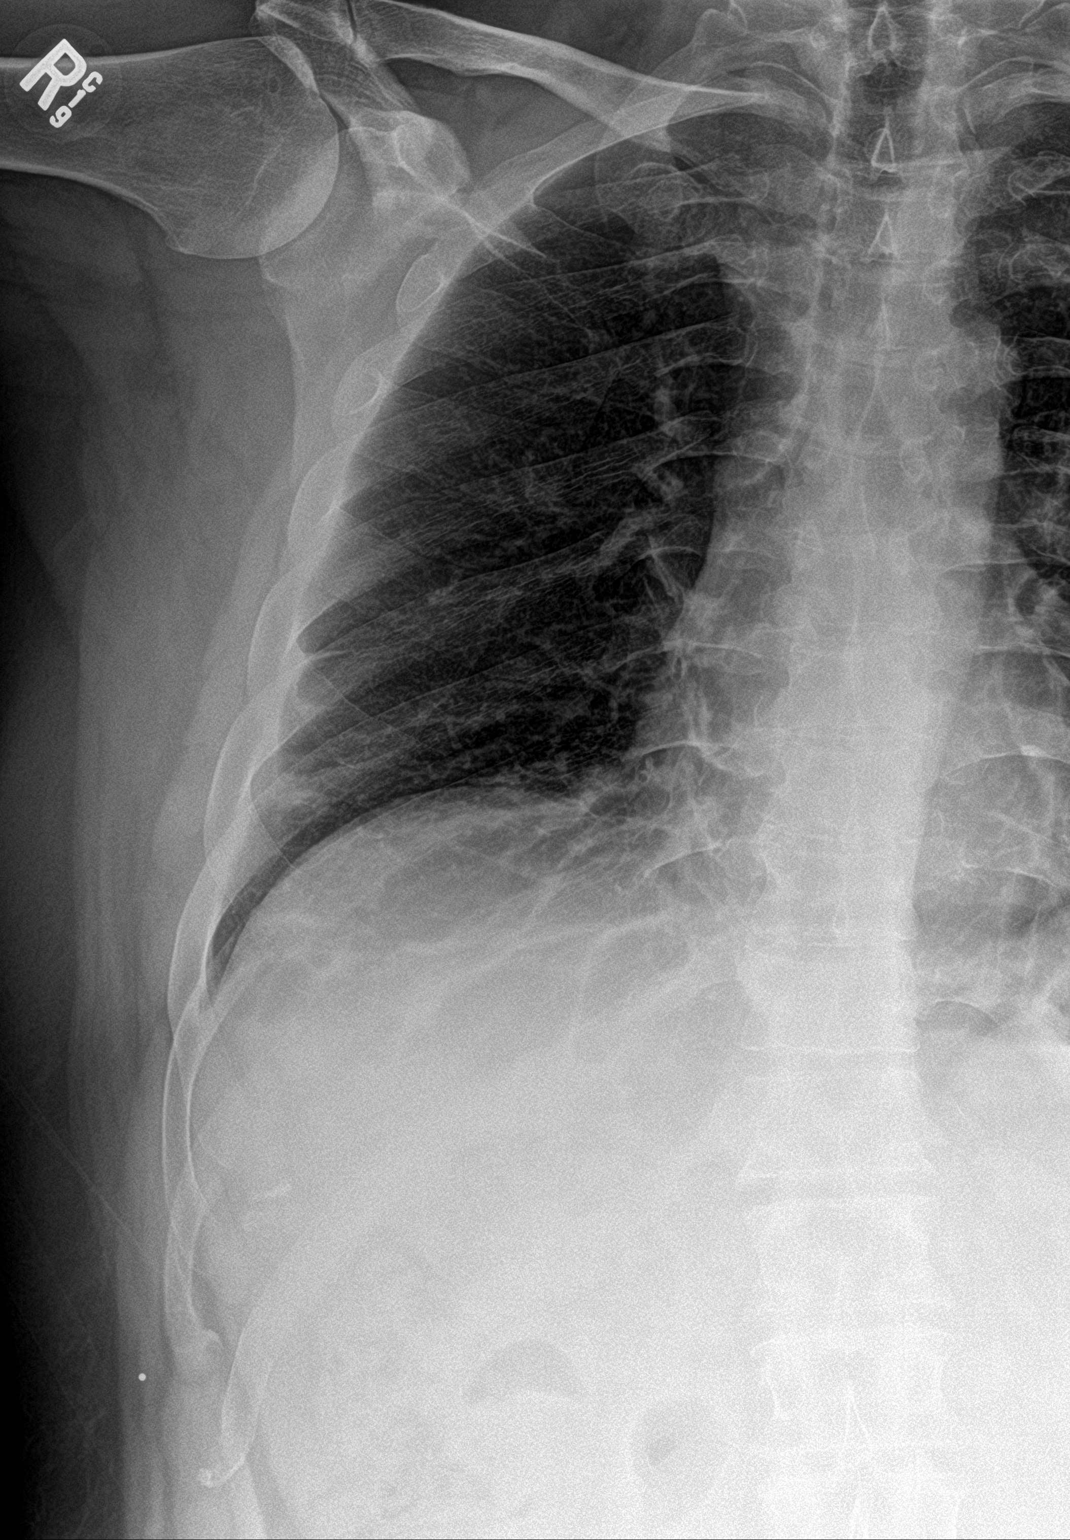
[im 3/3]
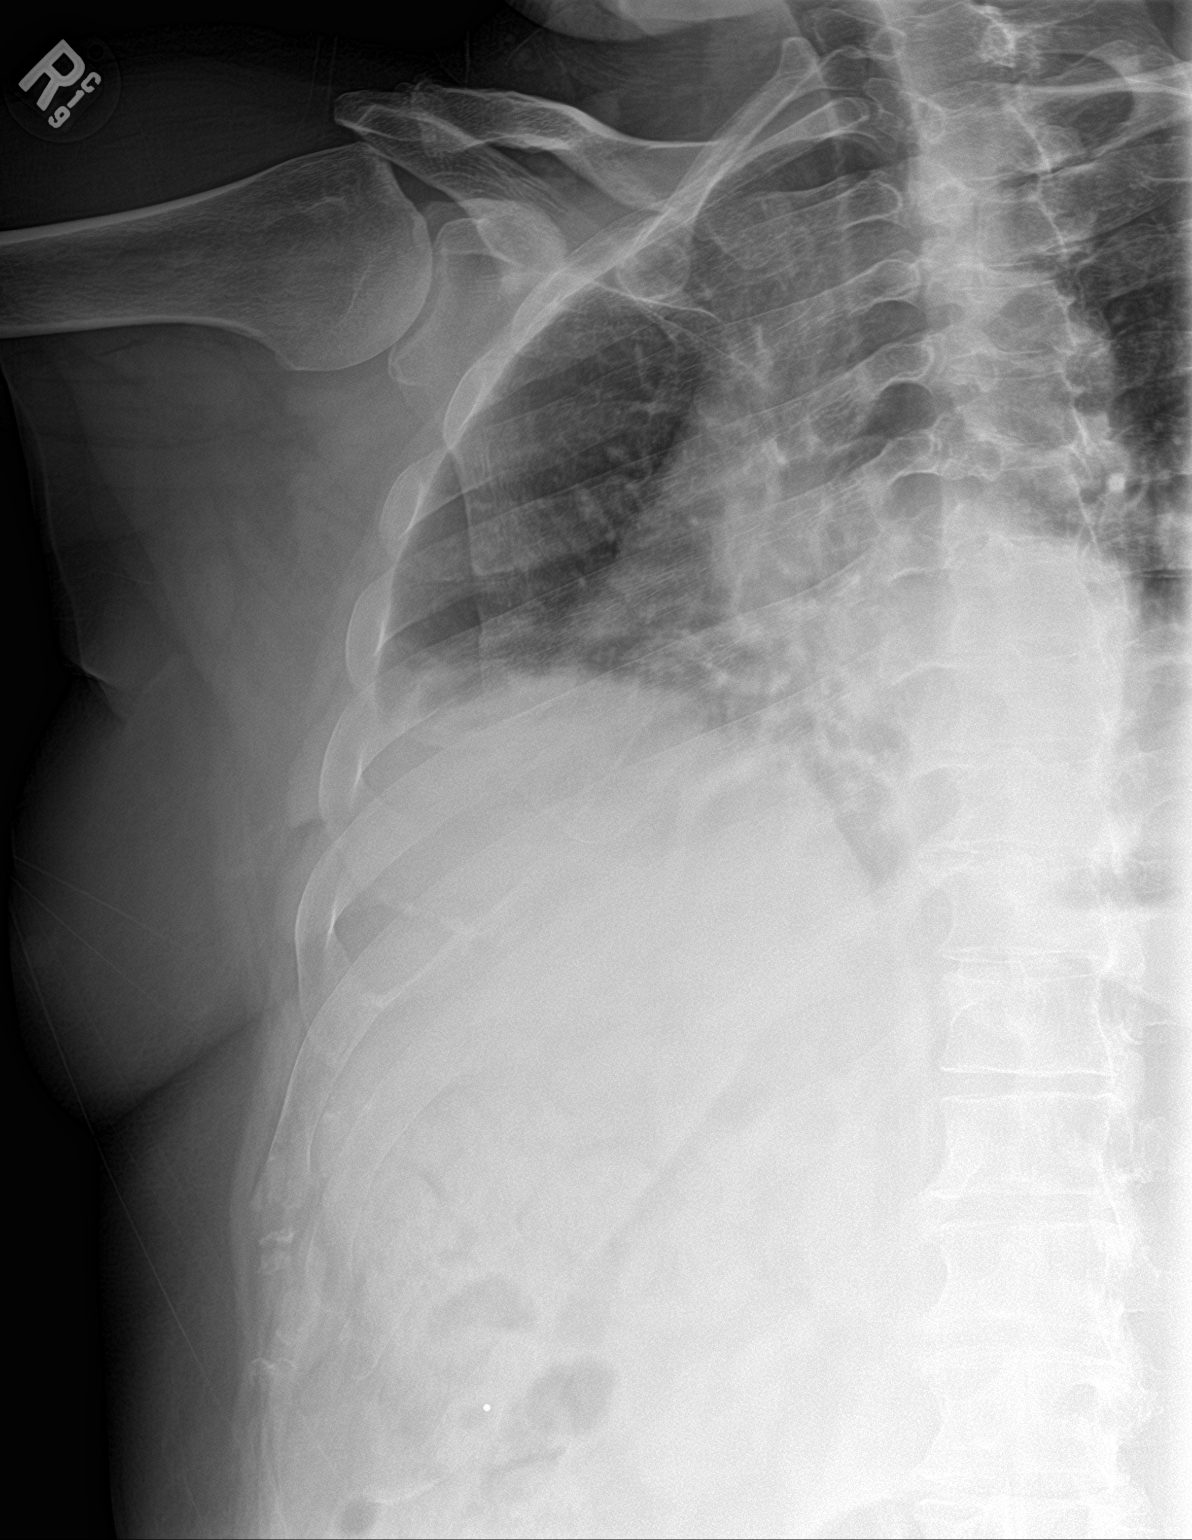

[3 of 3 positions shown; findings below may reference images not displayed]

FINDINGS: The heart size and mediastinal contours are normal. There is mild
atelectasis at both lung bases. No pleural effusion or pneumothorax.
A metallic BB was placed over the area of pain along the right
inferolateral costal margin. No underlying rib fracture or focal rib
lesion identified. There are mild degenerative changes within the
spine.
IMPRESSION: No evidence of right rib fracture, pleural effusion or pneumothorax.
Mild bibasilar atelectasis.
